# Patient Record
Sex: Male | Born: 1940 | Race: White | Hispanic: No | Marital: Married | Smoking: Former smoker
Health system: Southern US, Community
[De-identification: ages and names within clinical notes are randomized; demographics above are authoritative.]

## PROBLEM LIST (undated history)

## (undated) DIAGNOSIS — M1990 Unspecified osteoarthritis, unspecified site: Secondary | ICD-10-CM

## (undated) DIAGNOSIS — Z87442 Personal history of urinary calculi: Secondary | ICD-10-CM

## (undated) DIAGNOSIS — I10 Essential (primary) hypertension: Secondary | ICD-10-CM

## (undated) HISTORY — PX: APPENDECTOMY: SHX54

---

## 1982-10-28 HISTORY — PX: BACK SURGERY: SHX140

## 2015-02-21 ENCOUNTER — Other Ambulatory Visit: Payer: Self-pay | Admitting: Gastroenterology

## 2015-02-21 ENCOUNTER — Encounter (HOSPITAL_COMMUNITY): Payer: Self-pay | Admitting: *Deleted

## 2015-02-28 ENCOUNTER — Ambulatory Visit (HOSPITAL_COMMUNITY)
Admission: RE | Admit: 2015-02-28 | Discharge: 2015-02-28 | Disposition: A | Discharge status: Home / Self Care | Payer: Medicare Other | Source: Ambulatory Visit | Attending: Gastroenterology | Admitting: Gastroenterology

## 2015-02-28 ENCOUNTER — Encounter (HOSPITAL_COMMUNITY): Payer: Self-pay | Admitting: Certified Registered Nurse Anesthetist

## 2015-02-28 ENCOUNTER — Encounter (HOSPITAL_COMMUNITY)
Admission: RE | Disposition: A | Discharge status: Home / Self Care | Payer: Self-pay | Source: Ambulatory Visit | Attending: Gastroenterology

## 2015-02-28 ENCOUNTER — Ambulatory Visit (HOSPITAL_COMMUNITY): Payer: Medicare Other | Admitting: Certified Registered Nurse Anesthetist

## 2015-02-28 DIAGNOSIS — D125 Benign neoplasm of sigmoid colon: Secondary | ICD-10-CM | POA: Diagnosis not present

## 2015-02-28 DIAGNOSIS — Z86010 Personal history of colonic polyps: Secondary | ICD-10-CM | POA: Insufficient documentation

## 2015-02-28 DIAGNOSIS — I10 Essential (primary) hypertension: Secondary | ICD-10-CM | POA: Insufficient documentation

## 2015-02-28 DIAGNOSIS — M1990 Unspecified osteoarthritis, unspecified site: Secondary | ICD-10-CM | POA: Insufficient documentation

## 2015-02-28 DIAGNOSIS — Z79899 Other long term (current) drug therapy: Secondary | ICD-10-CM | POA: Insufficient documentation

## 2015-02-28 DIAGNOSIS — Z09 Encounter for follow-up examination after completed treatment for conditions other than malignant neoplasm: Secondary | ICD-10-CM | POA: Diagnosis present

## 2015-02-28 DIAGNOSIS — Z87891 Personal history of nicotine dependence: Secondary | ICD-10-CM | POA: Diagnosis not present

## 2015-02-28 DIAGNOSIS — K621 Rectal polyp: Secondary | ICD-10-CM | POA: Diagnosis not present

## 2015-02-28 DIAGNOSIS — D122 Benign neoplasm of ascending colon: Secondary | ICD-10-CM | POA: Insufficient documentation

## 2015-02-28 HISTORY — DX: Essential (primary) hypertension: I10

## 2015-02-28 HISTORY — PX: COLONOSCOPY WITH PROPOFOL: SHX5780

## 2015-02-28 HISTORY — DX: Personal history of urinary calculi: Z87.442

## 2015-02-28 HISTORY — DX: Unspecified osteoarthritis, unspecified site: M19.90

## 2015-02-28 SURGERY — COLONOSCOPY WITH PROPOFOL
Anesthesia: Monitor Anesthesia Care

## 2015-02-28 MED ORDER — PROPOFOL 10 MG/ML IV BOLUS
INTRAVENOUS | Status: AC
  Filled 2015-02-28: qty 20

## 2015-02-28 MED ORDER — PROPOFOL 500 MG/50ML IV EMUL
INTRAVENOUS | Status: DC | PRN
  Administered 2015-02-28: 20 mg via INTRAVENOUS
  Administered 2015-02-28: 50 mg via INTRAVENOUS
  Administered 2015-02-28 (×4): 10 mg via INTRAVENOUS
  Administered 2015-02-28: 20 mg via INTRAVENOUS
  Administered 2015-02-28: 10 mg via INTRAVENOUS
  Administered 2015-02-28: 30 mg via INTRAVENOUS
  Administered 2015-02-28 (×2): 10 mg via INTRAVENOUS
  Administered 2015-02-28: 20 mg via INTRAVENOUS
  Administered 2015-02-28: 50 mg via INTRAVENOUS
  Administered 2015-02-28: 10 mg via INTRAVENOUS
  Administered 2015-02-28: 30 mg via INTRAVENOUS
  Administered 2015-02-28: 10 mg via INTRAVENOUS
  Administered 2015-02-28: 20 mg via INTRAVENOUS

## 2015-02-28 MED ORDER — LACTATED RINGERS IV SOLN
INTRAVENOUS | Status: DC
Start: 2015-02-28 — End: 2015-02-28
  Administered 2015-02-28: 1000 mL via INTRAVENOUS
  Administered 2015-02-28: 11:00:00 via INTRAVENOUS

## 2015-02-28 MED ORDER — SODIUM CHLORIDE 0.9 % IV SOLN: INTRAVENOUS | Status: DC

## 2015-02-28 SURGICAL SUPPLY — 21 items

## 2015-02-28 NOTE — Transfer of Care (Signed)
Immediate Anesthesia Transfer of Care Note  Patient: Jacob Adkins  Procedure(s) Performed: Procedure(s): COLONOSCOPY WITH PROPOFOL (N/A)  Patient Location: PACU  Anesthesia Type:MAC  Level of Consciousness: awake, alert  and oriented  Airway & Oxygen Therapy: Patient Spontanous Breathing and Patient connected to face mask oxygen  Post-op Assessment: Report given to RN and Post -op Vital signs reviewed and stable  Post vital signs: Reviewed and stable  Last Vitals:  Filed Vitals:   02/28/15 1048  BP: 151/69  Temp: 36.8 C  Resp: 14    Complications: No apparent anesthesia complications

## 2015-02-28 NOTE — Anesthesia Preprocedure Evaluation (Signed)
Anesthesia Evaluation  Patient identified by MRN, date of birth, ID band Patient awake    Reviewed: Allergy & Precautions, NPO status , Patient's Chart, lab work & pertinent test results  Airway Mallampati: II  TM Distance: >3 FB Neck ROM: Full    Dental no notable dental hx.    Pulmonary former smoker,  breath sounds clear to auscultation  Pulmonary exam normal       Cardiovascular Exercise Tolerance: Good hypertension, Pt. on medications Rhythm:Regular Rate:Normal     Neuro/Psych negative neurological ROS  negative psych ROS   GI/Hepatic negative GI ROS, Neg liver ROS,   Endo/Other  negative endocrine ROS  Renal/GU negative Renal ROS  negative genitourinary   Musculoskeletal  (+) Arthritis -,   Abdominal   Peds negative pediatric ROS (+)  Hematology negative hematology ROS (+)   Anesthesia Other Findings   Reproductive/Obstetrics negative OB ROS                             Anesthesia Physical Anesthesia Plan  ASA: II  Anesthesia Plan: MAC   Post-op Pain Management:    Induction: Intravenous  Airway Management Planned:   Additional Equipment:   Intra-op Plan:   Post-operative Plan:   Informed Consent: I have reviewed the patients History and Physical, chart, labs and discussed the procedure including the risks, benefits and alternatives for the proposed anesthesia with the patient or authorized representative who has indicated his/her understanding and acceptance.   Dental advisory given  Plan Discussed with: CRNA  Anesthesia Plan Comments:         Anesthesia Quick Evaluation

## 2015-02-28 NOTE — Anesthesia Postprocedure Evaluation (Signed)
  Anesthesia Post-op Note  Patient: Jacob Adkins  Procedure(s) Performed: Procedure(s) (LRB): COLONOSCOPY WITH PROPOFOL (N/A)  Patient Location: PACU  Anesthesia Type: MAC  Level of Consciousness: awake and alert   Airway and Oxygen Therapy: Patient Spontanous Breathing  Post-op Pain: mild  Post-op Assessment: Post-op Vital signs reviewed, Patient's Cardiovascular Status Stable, Respiratory Function Stable, Patent Airway and No signs of Nausea or vomiting  Last Vitals:  Filed Vitals:   02/28/15 1200  BP: 147/90  Pulse: 71  Temp:   Resp: 18    Post-op Vital Signs: stable   Complications: No apparent anesthesia complications

## 2015-02-28 NOTE — H&P (Signed)
  Procedure: Surveillance colonoscopy. Adenomatous colon polyps removed colonoscopically in 2013  History: The patient is a 74 year old male born Mar 18, 1941. He is scheduled to undergo a surveillance colonoscopy today.  Past medical history: Hypertension. Osteoarthritis. Bee sting allergy. Appendectomy. Back surgery for ruptured disc. Left arm fracture surgery. Left inguinal hernia.  Allergies: Bee sting  Exam: The patient is alert and lying comfortably on the endoscopy stretcher. Abdomen is soft and nontender to palpation. Lungs are clear to auscultation. Cardiac exam reveals a regular rhythm.  Plan: Proceed with surveillance colonoscopy

## 2015-02-28 NOTE — Op Note (Signed)
Procedure: Surveillance colonoscopy. History of adenomatous colon polyps removed colonoscopically in the past  Endoscopist: Earle Gell  Premedication: Propofol administered by anesthesia  Procedure: The patient was placed in the left lateral decubitus position. Anal inspection and digital rectal exam were normal. The Pentax pediatric colonoscope was introduced into the rectum and advanced to the cecum. A normal-appearing appendiceal orifice and ileocecal valve were identified. Colonic preparation for the exam today was good. Withdrawal time was 25 minutes  Rectum. From the mid rectum a 3 mm sessile polyp was removed with the cold snare. Retroflexed view of the distal rectum was normal  Sigmoid colon. From the mid sigmoid colon, a 3 mm sessile polyp was removed with the cold biopsy forceps  Descending colon. Normal  Splenic flexure. Normal  Transverse colon. Normal  Hepatic flexure. Normal  Ascending colon. A 5 mm sessile polyp was removed with the hot snare and a 3 mm sessile polyp was removed with the cold biopsy forceps  Cecum and ileocecal valve. Normal  Assessment: A diminutive polyp was removed from the rectum, a diminutive polyp was removed from the sigmoid colon, a diminutive polyp was removed from the ascending colon, and a small polyp was removed from the ascending colon.

## 2015-03-01 ENCOUNTER — Encounter (HOSPITAL_COMMUNITY): Payer: Self-pay | Admitting: Gastroenterology

## 2015-11-30 DIAGNOSIS — T63461D Toxic effect of venom of wasps, accidental (unintentional), subsequent encounter: Secondary | ICD-10-CM | POA: Diagnosis not present

## 2015-11-30 DIAGNOSIS — T63451D Toxic effect of venom of hornets, accidental (unintentional), subsequent encounter: Secondary | ICD-10-CM | POA: Diagnosis not present

## 2016-01-12 DIAGNOSIS — T63451D Toxic effect of venom of hornets, accidental (unintentional), subsequent encounter: Secondary | ICD-10-CM | POA: Diagnosis not present

## 2016-01-12 DIAGNOSIS — T63461D Toxic effect of venom of wasps, accidental (unintentional), subsequent encounter: Secondary | ICD-10-CM | POA: Diagnosis not present

## 2016-02-26 DIAGNOSIS — T63451D Toxic effect of venom of hornets, accidental (unintentional), subsequent encounter: Secondary | ICD-10-CM | POA: Diagnosis not present

## 2016-02-26 DIAGNOSIS — T63461D Toxic effect of venom of wasps, accidental (unintentional), subsequent encounter: Secondary | ICD-10-CM | POA: Diagnosis not present

## 2016-03-28 DIAGNOSIS — D1801 Hemangioma of skin and subcutaneous tissue: Secondary | ICD-10-CM | POA: Diagnosis not present

## 2016-03-28 DIAGNOSIS — L570 Actinic keratosis: Secondary | ICD-10-CM | POA: Diagnosis not present

## 2016-03-28 DIAGNOSIS — L821 Other seborrheic keratosis: Secondary | ICD-10-CM | POA: Diagnosis not present

## 2016-03-28 DIAGNOSIS — D2261 Melanocytic nevi of right upper limb, including shoulder: Secondary | ICD-10-CM | POA: Diagnosis not present

## 2016-03-28 DIAGNOSIS — D225 Melanocytic nevi of trunk: Secondary | ICD-10-CM | POA: Diagnosis not present

## 2016-03-28 DIAGNOSIS — L814 Other melanin hyperpigmentation: Secondary | ICD-10-CM | POA: Diagnosis not present

## 2016-04-15 DIAGNOSIS — T63451D Toxic effect of venom of hornets, accidental (unintentional), subsequent encounter: Secondary | ICD-10-CM | POA: Diagnosis not present

## 2016-04-15 DIAGNOSIS — T63461D Toxic effect of venom of wasps, accidental (unintentional), subsequent encounter: Secondary | ICD-10-CM | POA: Diagnosis not present

## 2016-05-27 DIAGNOSIS — I10 Essential (primary) hypertension: Secondary | ICD-10-CM | POA: Diagnosis not present

## 2016-05-27 DIAGNOSIS — T63451D Toxic effect of venom of hornets, accidental (unintentional), subsequent encounter: Secondary | ICD-10-CM | POA: Diagnosis not present

## 2016-05-27 DIAGNOSIS — T63461D Toxic effect of venom of wasps, accidental (unintentional), subsequent encounter: Secondary | ICD-10-CM | POA: Diagnosis not present

## 2016-07-03 DIAGNOSIS — L720 Epidermal cyst: Secondary | ICD-10-CM | POA: Diagnosis not present

## 2016-07-03 DIAGNOSIS — D225 Melanocytic nevi of trunk: Secondary | ICD-10-CM | POA: Diagnosis not present

## 2016-07-03 DIAGNOSIS — L821 Other seborrheic keratosis: Secondary | ICD-10-CM | POA: Diagnosis not present

## 2016-07-09 DIAGNOSIS — T63451D Toxic effect of venom of hornets, accidental (unintentional), subsequent encounter: Secondary | ICD-10-CM | POA: Diagnosis not present

## 2016-07-09 DIAGNOSIS — T63461D Toxic effect of venom of wasps, accidental (unintentional), subsequent encounter: Secondary | ICD-10-CM | POA: Diagnosis not present

## 2016-07-23 DIAGNOSIS — T63461D Toxic effect of venom of wasps, accidental (unintentional), subsequent encounter: Secondary | ICD-10-CM | POA: Diagnosis not present

## 2016-07-23 DIAGNOSIS — T63451D Toxic effect of venom of hornets, accidental (unintentional), subsequent encounter: Secondary | ICD-10-CM | POA: Diagnosis not present

## 2016-07-30 DIAGNOSIS — T63451D Toxic effect of venom of hornets, accidental (unintentional), subsequent encounter: Secondary | ICD-10-CM | POA: Diagnosis not present

## 2016-07-30 DIAGNOSIS — T63461D Toxic effect of venom of wasps, accidental (unintentional), subsequent encounter: Secondary | ICD-10-CM | POA: Diagnosis not present

## 2016-08-01 DIAGNOSIS — Z23 Encounter for immunization: Secondary | ICD-10-CM | POA: Diagnosis not present

## 2016-08-06 DIAGNOSIS — T63451D Toxic effect of venom of hornets, accidental (unintentional), subsequent encounter: Secondary | ICD-10-CM | POA: Diagnosis not present

## 2016-08-06 DIAGNOSIS — T63461D Toxic effect of venom of wasps, accidental (unintentional), subsequent encounter: Secondary | ICD-10-CM | POA: Diagnosis not present

## 2016-08-13 DIAGNOSIS — T63451D Toxic effect of venom of hornets, accidental (unintentional), subsequent encounter: Secondary | ICD-10-CM | POA: Diagnosis not present

## 2016-08-13 DIAGNOSIS — T63461D Toxic effect of venom of wasps, accidental (unintentional), subsequent encounter: Secondary | ICD-10-CM | POA: Diagnosis not present

## 2016-09-12 DIAGNOSIS — T63441D Toxic effect of venom of bees, accidental (unintentional), subsequent encounter: Secondary | ICD-10-CM | POA: Diagnosis not present

## 2016-09-12 DIAGNOSIS — T63451D Toxic effect of venom of hornets, accidental (unintentional), subsequent encounter: Secondary | ICD-10-CM | POA: Diagnosis not present

## 2016-09-24 DIAGNOSIS — T63461D Toxic effect of venom of wasps, accidental (unintentional), subsequent encounter: Secondary | ICD-10-CM | POA: Diagnosis not present

## 2016-09-24 DIAGNOSIS — T63451D Toxic effect of venom of hornets, accidental (unintentional), subsequent encounter: Secondary | ICD-10-CM | POA: Diagnosis not present

## 2016-11-05 DIAGNOSIS — T63461D Toxic effect of venom of wasps, accidental (unintentional), subsequent encounter: Secondary | ICD-10-CM | POA: Diagnosis not present

## 2016-11-05 DIAGNOSIS — T63451D Toxic effect of venom of hornets, accidental (unintentional), subsequent encounter: Secondary | ICD-10-CM | POA: Diagnosis not present

## 2016-12-03 DIAGNOSIS — Z0000 Encounter for general adult medical examination without abnormal findings: Secondary | ICD-10-CM | POA: Diagnosis not present

## 2016-12-03 DIAGNOSIS — Z79899 Other long term (current) drug therapy: Secondary | ICD-10-CM | POA: Diagnosis not present

## 2016-12-03 DIAGNOSIS — Z1389 Encounter for screening for other disorder: Secondary | ICD-10-CM | POA: Diagnosis not present

## 2016-12-03 DIAGNOSIS — I10 Essential (primary) hypertension: Secondary | ICD-10-CM | POA: Diagnosis not present

## 2016-12-17 DIAGNOSIS — T63461D Toxic effect of venom of wasps, accidental (unintentional), subsequent encounter: Secondary | ICD-10-CM | POA: Diagnosis not present

## 2016-12-17 DIAGNOSIS — T63451D Toxic effect of venom of hornets, accidental (unintentional), subsequent encounter: Secondary | ICD-10-CM | POA: Diagnosis not present

## 2017-01-28 DIAGNOSIS — T63451D Toxic effect of venom of hornets, accidental (unintentional), subsequent encounter: Secondary | ICD-10-CM | POA: Diagnosis not present

## 2017-01-28 DIAGNOSIS — T63461D Toxic effect of venom of wasps, accidental (unintentional), subsequent encounter: Secondary | ICD-10-CM | POA: Diagnosis not present

## 2017-03-11 DIAGNOSIS — T63451D Toxic effect of venom of hornets, accidental (unintentional), subsequent encounter: Secondary | ICD-10-CM | POA: Diagnosis not present

## 2017-03-11 DIAGNOSIS — T63461D Toxic effect of venom of wasps, accidental (unintentional), subsequent encounter: Secondary | ICD-10-CM | POA: Diagnosis not present

## 2017-04-21 DIAGNOSIS — T63451D Toxic effect of venom of hornets, accidental (unintentional), subsequent encounter: Secondary | ICD-10-CM | POA: Diagnosis not present

## 2017-04-21 DIAGNOSIS — T63461D Toxic effect of venom of wasps, accidental (unintentional), subsequent encounter: Secondary | ICD-10-CM | POA: Diagnosis not present

## 2017-06-02 DIAGNOSIS — I10 Essential (primary) hypertension: Secondary | ICD-10-CM | POA: Diagnosis not present

## 2017-06-03 DIAGNOSIS — T63461D Toxic effect of venom of wasps, accidental (unintentional), subsequent encounter: Secondary | ICD-10-CM | POA: Diagnosis not present

## 2017-06-03 DIAGNOSIS — T63451D Toxic effect of venom of hornets, accidental (unintentional), subsequent encounter: Secondary | ICD-10-CM | POA: Diagnosis not present

## 2017-06-05 DIAGNOSIS — H524 Presbyopia: Secondary | ICD-10-CM | POA: Diagnosis not present

## 2017-07-10 DIAGNOSIS — Z23 Encounter for immunization: Secondary | ICD-10-CM | POA: Diagnosis not present

## 2017-07-15 DIAGNOSIS — T63451D Toxic effect of venom of hornets, accidental (unintentional), subsequent encounter: Secondary | ICD-10-CM | POA: Diagnosis not present

## 2017-07-15 DIAGNOSIS — T63461D Toxic effect of venom of wasps, accidental (unintentional), subsequent encounter: Secondary | ICD-10-CM | POA: Diagnosis not present

## 2017-08-26 DIAGNOSIS — T63451D Toxic effect of venom of hornets, accidental (unintentional), subsequent encounter: Secondary | ICD-10-CM | POA: Diagnosis not present

## 2017-08-26 DIAGNOSIS — T63461D Toxic effect of venom of wasps, accidental (unintentional), subsequent encounter: Secondary | ICD-10-CM | POA: Diagnosis not present

## 2017-09-26 DIAGNOSIS — T63441D Toxic effect of venom of bees, accidental (unintentional), subsequent encounter: Secondary | ICD-10-CM | POA: Diagnosis not present

## 2017-09-26 DIAGNOSIS — T63451D Toxic effect of venom of hornets, accidental (unintentional), subsequent encounter: Secondary | ICD-10-CM | POA: Diagnosis not present

## 2017-10-08 DIAGNOSIS — T63461D Toxic effect of venom of wasps, accidental (unintentional), subsequent encounter: Secondary | ICD-10-CM | POA: Diagnosis not present

## 2017-10-08 DIAGNOSIS — T63451D Toxic effect of venom of hornets, accidental (unintentional), subsequent encounter: Secondary | ICD-10-CM | POA: Diagnosis not present

## 2017-11-21 DIAGNOSIS — T63461D Toxic effect of venom of wasps, accidental (unintentional), subsequent encounter: Secondary | ICD-10-CM | POA: Diagnosis not present

## 2017-11-21 DIAGNOSIS — T63451D Toxic effect of venom of hornets, accidental (unintentional), subsequent encounter: Secondary | ICD-10-CM | POA: Diagnosis not present

## 2017-12-15 DIAGNOSIS — Z0000 Encounter for general adult medical examination without abnormal findings: Secondary | ICD-10-CM | POA: Diagnosis not present

## 2017-12-15 DIAGNOSIS — Z79899 Other long term (current) drug therapy: Secondary | ICD-10-CM | POA: Diagnosis not present

## 2017-12-15 DIAGNOSIS — I10 Essential (primary) hypertension: Secondary | ICD-10-CM | POA: Diagnosis not present

## 2017-12-15 DIAGNOSIS — Z1389 Encounter for screening for other disorder: Secondary | ICD-10-CM | POA: Diagnosis not present

## 2018-01-02 DIAGNOSIS — T63451D Toxic effect of venom of hornets, accidental (unintentional), subsequent encounter: Secondary | ICD-10-CM | POA: Diagnosis not present

## 2018-01-02 DIAGNOSIS — T63461D Toxic effect of venom of wasps, accidental (unintentional), subsequent encounter: Secondary | ICD-10-CM | POA: Diagnosis not present

## 2018-02-16 DIAGNOSIS — T63461D Toxic effect of venom of wasps, accidental (unintentional), subsequent encounter: Secondary | ICD-10-CM | POA: Diagnosis not present

## 2018-02-16 DIAGNOSIS — T63451D Toxic effect of venom of hornets, accidental (unintentional), subsequent encounter: Secondary | ICD-10-CM | POA: Diagnosis not present

## 2018-03-27 DIAGNOSIS — T63451D Toxic effect of venom of hornets, accidental (unintentional), subsequent encounter: Secondary | ICD-10-CM | POA: Diagnosis not present

## 2018-03-27 DIAGNOSIS — T63461D Toxic effect of venom of wasps, accidental (unintentional), subsequent encounter: Secondary | ICD-10-CM | POA: Diagnosis not present

## 2018-03-30 DIAGNOSIS — K648 Other hemorrhoids: Secondary | ICD-10-CM | POA: Diagnosis not present

## 2018-03-30 DIAGNOSIS — D126 Benign neoplasm of colon, unspecified: Secondary | ICD-10-CM | POA: Diagnosis not present

## 2018-03-30 DIAGNOSIS — Z86010 Personal history of colonic polyps: Secondary | ICD-10-CM | POA: Diagnosis not present

## 2018-03-30 DIAGNOSIS — K5730 Diverticulosis of large intestine without perforation or abscess without bleeding: Secondary | ICD-10-CM | POA: Diagnosis not present

## 2018-04-01 DIAGNOSIS — D126 Benign neoplasm of colon, unspecified: Secondary | ICD-10-CM | POA: Diagnosis not present

## 2018-05-08 DIAGNOSIS — T63461D Toxic effect of venom of wasps, accidental (unintentional), subsequent encounter: Secondary | ICD-10-CM | POA: Diagnosis not present

## 2018-05-08 DIAGNOSIS — T63451D Toxic effect of venom of hornets, accidental (unintentional), subsequent encounter: Secondary | ICD-10-CM | POA: Diagnosis not present

## 2018-06-16 DIAGNOSIS — I10 Essential (primary) hypertension: Secondary | ICD-10-CM | POA: Diagnosis not present

## 2018-06-16 DIAGNOSIS — Z79899 Other long term (current) drug therapy: Secondary | ICD-10-CM | POA: Diagnosis not present

## 2018-06-16 DIAGNOSIS — R32 Unspecified urinary incontinence: Secondary | ICD-10-CM | POA: Diagnosis not present

## 2018-06-19 DIAGNOSIS — T63441D Toxic effect of venom of bees, accidental (unintentional), subsequent encounter: Secondary | ICD-10-CM | POA: Diagnosis not present

## 2018-06-19 DIAGNOSIS — T63451D Toxic effect of venom of hornets, accidental (unintentional), subsequent encounter: Secondary | ICD-10-CM | POA: Diagnosis not present

## 2018-06-19 DIAGNOSIS — T63461D Toxic effect of venom of wasps, accidental (unintentional), subsequent encounter: Secondary | ICD-10-CM | POA: Diagnosis not present

## 2018-07-16 DIAGNOSIS — Z23 Encounter for immunization: Secondary | ICD-10-CM | POA: Diagnosis not present

## 2018-07-31 DIAGNOSIS — T63441D Toxic effect of venom of bees, accidental (unintentional), subsequent encounter: Secondary | ICD-10-CM | POA: Diagnosis not present

## 2018-07-31 DIAGNOSIS — T63461D Toxic effect of venom of wasps, accidental (unintentional), subsequent encounter: Secondary | ICD-10-CM | POA: Diagnosis not present

## 2018-07-31 DIAGNOSIS — T63451D Toxic effect of venom of hornets, accidental (unintentional), subsequent encounter: Secondary | ICD-10-CM | POA: Diagnosis not present

## 2018-09-11 DIAGNOSIS — T63461D Toxic effect of venom of wasps, accidental (unintentional), subsequent encounter: Secondary | ICD-10-CM | POA: Diagnosis not present

## 2018-09-11 DIAGNOSIS — T63441D Toxic effect of venom of bees, accidental (unintentional), subsequent encounter: Secondary | ICD-10-CM | POA: Diagnosis not present

## 2018-09-11 DIAGNOSIS — T63451D Toxic effect of venom of hornets, accidental (unintentional), subsequent encounter: Secondary | ICD-10-CM | POA: Diagnosis not present

## 2018-09-28 DIAGNOSIS — T63451D Toxic effect of venom of hornets, accidental (unintentional), subsequent encounter: Secondary | ICD-10-CM | POA: Diagnosis not present

## 2018-09-28 DIAGNOSIS — T63441D Toxic effect of venom of bees, accidental (unintentional), subsequent encounter: Secondary | ICD-10-CM | POA: Diagnosis not present

## 2018-10-26 DIAGNOSIS — T63461D Toxic effect of venom of wasps, accidental (unintentional), subsequent encounter: Secondary | ICD-10-CM | POA: Diagnosis not present

## 2018-10-26 DIAGNOSIS — T63451D Toxic effect of venom of hornets, accidental (unintentional), subsequent encounter: Secondary | ICD-10-CM | POA: Diagnosis not present

## 2018-12-07 DIAGNOSIS — T63451D Toxic effect of venom of hornets, accidental (unintentional), subsequent encounter: Secondary | ICD-10-CM | POA: Diagnosis not present

## 2018-12-07 DIAGNOSIS — T63461D Toxic effect of venom of wasps, accidental (unintentional), subsequent encounter: Secondary | ICD-10-CM | POA: Diagnosis not present

## 2018-12-24 DIAGNOSIS — Z79899 Other long term (current) drug therapy: Secondary | ICD-10-CM | POA: Diagnosis not present

## 2018-12-24 DIAGNOSIS — Z1389 Encounter for screening for other disorder: Secondary | ICD-10-CM | POA: Diagnosis not present

## 2018-12-24 DIAGNOSIS — I10 Essential (primary) hypertension: Secondary | ICD-10-CM | POA: Diagnosis not present

## 2018-12-24 DIAGNOSIS — Z0000 Encounter for general adult medical examination without abnormal findings: Secondary | ICD-10-CM | POA: Diagnosis not present

## 2019-01-06 DIAGNOSIS — Z0120 Encounter for dental examination and cleaning without abnormal findings: Secondary | ICD-10-CM | POA: Diagnosis not present

## 2019-01-15 DIAGNOSIS — T63451D Toxic effect of venom of hornets, accidental (unintentional), subsequent encounter: Secondary | ICD-10-CM | POA: Diagnosis not present

## 2019-01-15 DIAGNOSIS — T63461D Toxic effect of venom of wasps, accidental (unintentional), subsequent encounter: Secondary | ICD-10-CM | POA: Diagnosis not present

## 2019-02-24 DIAGNOSIS — I10 Essential (primary) hypertension: Secondary | ICD-10-CM | POA: Diagnosis not present

## 2019-02-24 DIAGNOSIS — G4733 Obstructive sleep apnea (adult) (pediatric): Secondary | ICD-10-CM | POA: Diagnosis not present

## 2019-02-25 DIAGNOSIS — G4733 Obstructive sleep apnea (adult) (pediatric): Secondary | ICD-10-CM | POA: Diagnosis not present

## 2019-02-26 DIAGNOSIS — T63451D Toxic effect of venom of hornets, accidental (unintentional), subsequent encounter: Secondary | ICD-10-CM | POA: Diagnosis not present

## 2019-02-26 DIAGNOSIS — T63461D Toxic effect of venom of wasps, accidental (unintentional), subsequent encounter: Secondary | ICD-10-CM | POA: Diagnosis not present

## 2019-04-09 DIAGNOSIS — T63451D Toxic effect of venom of hornets, accidental (unintentional), subsequent encounter: Secondary | ICD-10-CM | POA: Diagnosis not present

## 2019-04-09 DIAGNOSIS — T63461D Toxic effect of venom of wasps, accidental (unintentional), subsequent encounter: Secondary | ICD-10-CM | POA: Diagnosis not present

## 2019-04-09 DIAGNOSIS — T63441D Toxic effect of venom of bees, accidental (unintentional), subsequent encounter: Secondary | ICD-10-CM | POA: Diagnosis not present

## 2019-05-21 DIAGNOSIS — T63461D Toxic effect of venom of wasps, accidental (unintentional), subsequent encounter: Secondary | ICD-10-CM | POA: Diagnosis not present

## 2019-05-21 DIAGNOSIS — T63451D Toxic effect of venom of hornets, accidental (unintentional), subsequent encounter: Secondary | ICD-10-CM | POA: Diagnosis not present

## 2019-07-02 DIAGNOSIS — T63461D Toxic effect of venom of wasps, accidental (unintentional), subsequent encounter: Secondary | ICD-10-CM | POA: Diagnosis not present

## 2019-07-02 DIAGNOSIS — T63451D Toxic effect of venom of hornets, accidental (unintentional), subsequent encounter: Secondary | ICD-10-CM | POA: Diagnosis not present

## 2019-07-14 DIAGNOSIS — Z23 Encounter for immunization: Secondary | ICD-10-CM | POA: Diagnosis not present

## 2019-07-22 DIAGNOSIS — Z0120 Encounter for dental examination and cleaning without abnormal findings: Secondary | ICD-10-CM | POA: Diagnosis not present

## 2019-08-12 DIAGNOSIS — T63461D Toxic effect of venom of wasps, accidental (unintentional), subsequent encounter: Secondary | ICD-10-CM | POA: Diagnosis not present

## 2019-08-12 DIAGNOSIS — T63451D Toxic effect of venom of hornets, accidental (unintentional), subsequent encounter: Secondary | ICD-10-CM | POA: Diagnosis not present

## 2019-08-25 ENCOUNTER — Other Ambulatory Visit: Payer: Self-pay | Admitting: Geriatric Medicine

## 2019-08-25 DIAGNOSIS — R1084 Generalized abdominal pain: Secondary | ICD-10-CM | POA: Diagnosis not present

## 2019-08-25 DIAGNOSIS — I10 Essential (primary) hypertension: Secondary | ICD-10-CM | POA: Diagnosis not present

## 2019-08-25 DIAGNOSIS — R1903 Right lower quadrant abdominal swelling, mass and lump: Secondary | ICD-10-CM | POA: Diagnosis not present

## 2019-08-25 DIAGNOSIS — Z23 Encounter for immunization: Secondary | ICD-10-CM | POA: Diagnosis not present

## 2019-08-25 DIAGNOSIS — R1031 Right lower quadrant pain: Secondary | ICD-10-CM

## 2019-08-26 DIAGNOSIS — H5203 Hypermetropia, bilateral: Secondary | ICD-10-CM | POA: Diagnosis not present

## 2019-08-27 ENCOUNTER — Other Ambulatory Visit: Payer: Self-pay

## 2019-08-27 ENCOUNTER — Ambulatory Visit
Admission: RE | Admit: 2019-08-27 | Discharge: 2019-08-27 | Disposition: A | Discharge status: Home / Self Care | Payer: Medicare Other | Source: Ambulatory Visit | Attending: Geriatric Medicine | Admitting: Geriatric Medicine

## 2019-08-27 DIAGNOSIS — N1330 Unspecified hydronephrosis: Secondary | ICD-10-CM | POA: Diagnosis not present

## 2019-08-27 DIAGNOSIS — R1031 Right lower quadrant pain: Secondary | ICD-10-CM

## 2019-08-27 DIAGNOSIS — K4090 Unilateral inguinal hernia, without obstruction or gangrene, not specified as recurrent: Secondary | ICD-10-CM | POA: Diagnosis not present

## 2019-08-27 DIAGNOSIS — K429 Umbilical hernia without obstruction or gangrene: Secondary | ICD-10-CM | POA: Diagnosis not present

## 2019-08-27 DIAGNOSIS — I700 Atherosclerosis of aorta: Secondary | ICD-10-CM | POA: Diagnosis not present

## 2019-08-27 MED ORDER — IOPAMIDOL (ISOVUE-300) INJECTION 61%
100.0000 mL | Freq: Once | INTRAVENOUS | Status: AC | PRN
  Administered 2019-08-27: 100 mL via INTRAVENOUS

## 2019-09-09 DIAGNOSIS — N401 Enlarged prostate with lower urinary tract symptoms: Secondary | ICD-10-CM | POA: Diagnosis not present

## 2019-09-09 DIAGNOSIS — Z125 Encounter for screening for malignant neoplasm of prostate: Secondary | ICD-10-CM | POA: Diagnosis not present

## 2019-09-09 DIAGNOSIS — R338 Other retention of urine: Secondary | ICD-10-CM | POA: Diagnosis not present

## 2019-09-09 DIAGNOSIS — C775 Secondary and unspecified malignant neoplasm of intrapelvic lymph nodes: Secondary | ICD-10-CM | POA: Diagnosis not present

## 2019-09-09 DIAGNOSIS — N130 Hydronephrosis with ureteropelvic junction obstruction: Secondary | ICD-10-CM | POA: Diagnosis not present

## 2019-09-10 ENCOUNTER — Other Ambulatory Visit: Payer: Self-pay | Admitting: Urology

## 2019-09-10 DIAGNOSIS — N130 Hydronephrosis with ureteropelvic junction obstruction: Secondary | ICD-10-CM

## 2019-09-13 DIAGNOSIS — C775 Secondary and unspecified malignant neoplasm of intrapelvic lymph nodes: Secondary | ICD-10-CM | POA: Diagnosis not present

## 2019-09-13 DIAGNOSIS — N130 Hydronephrosis with ureteropelvic junction obstruction: Secondary | ICD-10-CM | POA: Diagnosis not present

## 2019-09-13 DIAGNOSIS — R338 Other retention of urine: Secondary | ICD-10-CM | POA: Diagnosis not present

## 2019-09-15 DIAGNOSIS — K4090 Unilateral inguinal hernia, without obstruction or gangrene, not specified as recurrent: Secondary | ICD-10-CM | POA: Diagnosis not present

## 2019-09-15 DIAGNOSIS — Z87891 Personal history of nicotine dependence: Secondary | ICD-10-CM | POA: Diagnosis not present

## 2019-09-15 DIAGNOSIS — K7689 Other specified diseases of liver: Secondary | ICD-10-CM | POA: Diagnosis not present

## 2019-09-15 DIAGNOSIS — I2510 Atherosclerotic heart disease of native coronary artery without angina pectoris: Secondary | ICD-10-CM | POA: Diagnosis not present

## 2019-09-15 DIAGNOSIS — Z86010 Personal history of colonic polyps: Secondary | ICD-10-CM | POA: Diagnosis not present

## 2019-09-15 DIAGNOSIS — N281 Cyst of kidney, acquired: Secondary | ICD-10-CM | POA: Diagnosis not present

## 2019-09-15 DIAGNOSIS — Z79899 Other long term (current) drug therapy: Secondary | ICD-10-CM | POA: Diagnosis not present

## 2019-09-15 DIAGNOSIS — N400 Enlarged prostate without lower urinary tract symptoms: Secondary | ICD-10-CM | POA: Diagnosis not present

## 2019-09-15 DIAGNOSIS — N323 Diverticulum of bladder: Secondary | ICD-10-CM | POA: Diagnosis not present

## 2019-09-15 DIAGNOSIS — K5730 Diverticulosis of large intestine without perforation or abscess without bleeding: Secondary | ICD-10-CM | POA: Diagnosis not present

## 2019-09-15 DIAGNOSIS — N1330 Unspecified hydronephrosis: Secondary | ICD-10-CM | POA: Diagnosis not present

## 2019-09-15 DIAGNOSIS — R590 Localized enlarged lymph nodes: Secondary | ICD-10-CM | POA: Diagnosis not present

## 2019-09-16 ENCOUNTER — Telehealth: Payer: Self-pay | Admitting: Oncology

## 2019-09-16 NOTE — Telephone Encounter (Signed)
Received a new patient referral from Dr. Gloriann Loan at [facility] for metastatic pelvic lymphadenopathy. Jacob Adkins has been cld and scheduled to see Jacob Adkins on 12/3 at 2pm. Aware to arrive 15 minutes early.

## 2019-09-20 DIAGNOSIS — N3289 Other specified disorders of bladder: Secondary | ICD-10-CM | POA: Diagnosis not present

## 2019-09-20 DIAGNOSIS — R339 Retention of urine, unspecified: Secondary | ICD-10-CM | POA: Diagnosis not present

## 2019-09-20 DIAGNOSIS — R599 Enlarged lymph nodes, unspecified: Secondary | ICD-10-CM | POA: Diagnosis not present

## 2019-09-20 DIAGNOSIS — N1330 Unspecified hydronephrosis: Secondary | ICD-10-CM | POA: Diagnosis not present

## 2019-09-21 ENCOUNTER — Ambulatory Visit
Admission: RE | Admit: 2019-09-21 | Discharge: 2019-09-21 | Disposition: A | Discharge status: Home / Self Care | Payer: Medicare Other | Source: Ambulatory Visit | Attending: Urology | Admitting: Urology

## 2019-09-21 ENCOUNTER — Telehealth: Payer: Self-pay | Admitting: Oncology

## 2019-09-21 DIAGNOSIS — N130 Hydronephrosis with ureteropelvic junction obstruction: Secondary | ICD-10-CM

## 2019-09-21 DIAGNOSIS — N1330 Unspecified hydronephrosis: Secondary | ICD-10-CM | POA: Diagnosis not present

## 2019-09-21 NOTE — Telephone Encounter (Signed)
Called patient regarding providers request, patient will be seeing another provider and would like to cancel 12/03 appointment.

## 2019-09-22 DIAGNOSIS — T63461D Toxic effect of venom of wasps, accidental (unintentional), subsequent encounter: Secondary | ICD-10-CM | POA: Diagnosis not present

## 2019-09-22 DIAGNOSIS — T63451D Toxic effect of venom of hornets, accidental (unintentional), subsequent encounter: Secondary | ICD-10-CM | POA: Diagnosis not present

## 2019-09-28 ENCOUNTER — Ambulatory Visit: Payer: Medicare Other | Admitting: Oncology

## 2019-09-28 DIAGNOSIS — N3289 Other specified disorders of bladder: Secondary | ICD-10-CM | POA: Diagnosis not present

## 2019-09-28 DIAGNOSIS — I2510 Atherosclerotic heart disease of native coronary artery without angina pectoris: Secondary | ICD-10-CM | POA: Diagnosis not present

## 2019-09-28 DIAGNOSIS — R590 Localized enlarged lymph nodes: Secondary | ICD-10-CM | POA: Diagnosis not present

## 2019-09-28 DIAGNOSIS — N1330 Unspecified hydronephrosis: Secondary | ICD-10-CM | POA: Diagnosis not present

## 2019-09-28 DIAGNOSIS — K4090 Unilateral inguinal hernia, without obstruction or gangrene, not specified as recurrent: Secondary | ICD-10-CM | POA: Diagnosis not present

## 2019-09-28 DIAGNOSIS — N281 Cyst of kidney, acquired: Secondary | ICD-10-CM | POA: Diagnosis not present

## 2019-09-28 DIAGNOSIS — K7689 Other specified diseases of liver: Secondary | ICD-10-CM | POA: Diagnosis not present

## 2019-09-28 DIAGNOSIS — N2889 Other specified disorders of kidney and ureter: Secondary | ICD-10-CM | POA: Diagnosis not present

## 2019-09-29 ENCOUNTER — Ambulatory Visit: Payer: Medicare Other | Admitting: Oncology

## 2019-09-29 DIAGNOSIS — Z79899 Other long term (current) drug therapy: Secondary | ICD-10-CM | POA: Diagnosis not present

## 2019-09-29 DIAGNOSIS — Z87891 Personal history of nicotine dependence: Secondary | ICD-10-CM | POA: Diagnosis not present

## 2019-09-29 DIAGNOSIS — Z9889 Other specified postprocedural states: Secondary | ICD-10-CM | POA: Diagnosis not present

## 2019-09-29 DIAGNOSIS — R599 Enlarged lymph nodes, unspecified: Secondary | ICD-10-CM | POA: Diagnosis not present

## 2019-09-29 DIAGNOSIS — N281 Cyst of kidney, acquired: Secondary | ICD-10-CM | POA: Diagnosis not present

## 2019-09-29 DIAGNOSIS — R590 Localized enlarged lymph nodes: Secondary | ICD-10-CM | POA: Diagnosis not present

## 2019-09-30 ENCOUNTER — Ambulatory Visit: Payer: Medicare Other | Admitting: Oncology

## 2019-10-04 DIAGNOSIS — R591 Generalized enlarged lymph nodes: Secondary | ICD-10-CM | POA: Diagnosis not present

## 2019-10-04 DIAGNOSIS — T63441D Toxic effect of venom of bees, accidental (unintentional), subsequent encounter: Secondary | ICD-10-CM | POA: Diagnosis not present

## 2019-10-04 DIAGNOSIS — T63451D Toxic effect of venom of hornets, accidental (unintentional), subsequent encounter: Secondary | ICD-10-CM | POA: Diagnosis not present

## 2019-10-05 DIAGNOSIS — K429 Umbilical hernia without obstruction or gangrene: Secondary | ICD-10-CM | POA: Diagnosis not present

## 2019-10-05 DIAGNOSIS — R590 Localized enlarged lymph nodes: Secondary | ICD-10-CM | POA: Diagnosis not present

## 2019-10-05 DIAGNOSIS — N2889 Other specified disorders of kidney and ureter: Secondary | ICD-10-CM | POA: Diagnosis not present

## 2019-10-05 DIAGNOSIS — N3289 Other specified disorders of bladder: Secondary | ICD-10-CM | POA: Diagnosis not present

## 2019-10-05 DIAGNOSIS — R339 Retention of urine, unspecified: Secondary | ICD-10-CM | POA: Diagnosis not present

## 2019-10-05 DIAGNOSIS — N1330 Unspecified hydronephrosis: Secondary | ICD-10-CM | POA: Diagnosis not present

## 2019-10-06 DIAGNOSIS — R339 Retention of urine, unspecified: Secondary | ICD-10-CM | POA: Diagnosis not present

## 2019-10-06 DIAGNOSIS — N1330 Unspecified hydronephrosis: Secondary | ICD-10-CM | POA: Diagnosis not present

## 2019-10-06 DIAGNOSIS — N400 Enlarged prostate without lower urinary tract symptoms: Secondary | ICD-10-CM | POA: Diagnosis not present

## 2019-10-06 DIAGNOSIS — N281 Cyst of kidney, acquired: Secondary | ICD-10-CM | POA: Diagnosis not present

## 2019-10-06 DIAGNOSIS — N2889 Other specified disorders of kidney and ureter: Secondary | ICD-10-CM | POA: Diagnosis not present

## 2019-10-07 DIAGNOSIS — R590 Localized enlarged lymph nodes: Secondary | ICD-10-CM | POA: Diagnosis not present

## 2019-10-07 DIAGNOSIS — N400 Enlarged prostate without lower urinary tract symptoms: Secondary | ICD-10-CM | POA: Diagnosis not present

## 2019-10-07 DIAGNOSIS — Z01812 Encounter for preprocedural laboratory examination: Secondary | ICD-10-CM | POA: Diagnosis not present

## 2019-10-07 DIAGNOSIS — Z01810 Encounter for preprocedural cardiovascular examination: Secondary | ICD-10-CM | POA: Diagnosis not present

## 2019-10-07 DIAGNOSIS — Z01818 Encounter for other preprocedural examination: Secondary | ICD-10-CM | POA: Diagnosis not present

## 2019-10-07 DIAGNOSIS — I10 Essential (primary) hypertension: Secondary | ICD-10-CM | POA: Diagnosis not present

## 2019-10-07 DIAGNOSIS — K429 Umbilical hernia without obstruction or gangrene: Secondary | ICD-10-CM | POA: Diagnosis not present

## 2019-10-07 DIAGNOSIS — N2889 Other specified disorders of kidney and ureter: Secondary | ICD-10-CM | POA: Diagnosis not present

## 2019-10-07 DIAGNOSIS — Z87891 Personal history of nicotine dependence: Secondary | ICD-10-CM | POA: Diagnosis not present

## 2019-10-08 DIAGNOSIS — Z01818 Encounter for other preprocedural examination: Secondary | ICD-10-CM | POA: Diagnosis not present

## 2019-10-11 DIAGNOSIS — I2510 Atherosclerotic heart disease of native coronary artery without angina pectoris: Secondary | ICD-10-CM | POA: Diagnosis not present

## 2019-10-11 DIAGNOSIS — Z91030 Bee allergy status: Secondary | ICD-10-CM | POA: Diagnosis not present

## 2019-10-11 DIAGNOSIS — N1330 Unspecified hydronephrosis: Secondary | ICD-10-CM | POA: Diagnosis not present

## 2019-10-11 DIAGNOSIS — Z86010 Personal history of colonic polyps: Secondary | ICD-10-CM | POA: Diagnosis not present

## 2019-10-11 DIAGNOSIS — C689 Malignant neoplasm of urinary organ, unspecified: Secondary | ICD-10-CM | POA: Diagnosis not present

## 2019-10-11 DIAGNOSIS — Z466 Encounter for fitting and adjustment of urinary device: Secondary | ICD-10-CM | POA: Diagnosis not present

## 2019-10-11 DIAGNOSIS — C652 Malignant neoplasm of left renal pelvis: Secondary | ICD-10-CM | POA: Diagnosis not present

## 2019-10-11 DIAGNOSIS — N2889 Other specified disorders of kidney and ureter: Secondary | ICD-10-CM | POA: Diagnosis not present

## 2019-10-11 DIAGNOSIS — M1990 Unspecified osteoarthritis, unspecified site: Secondary | ICD-10-CM | POA: Diagnosis not present

## 2019-10-15 DIAGNOSIS — C642 Malignant neoplasm of left kidney, except renal pelvis: Secondary | ICD-10-CM | POA: Diagnosis not present

## 2019-10-15 DIAGNOSIS — R590 Localized enlarged lymph nodes: Secondary | ICD-10-CM | POA: Diagnosis not present

## 2019-10-15 DIAGNOSIS — K429 Umbilical hernia without obstruction or gangrene: Secondary | ICD-10-CM | POA: Diagnosis not present

## 2019-10-15 DIAGNOSIS — K7689 Other specified diseases of liver: Secondary | ICD-10-CM | POA: Diagnosis not present

## 2019-10-15 DIAGNOSIS — N2889 Other specified disorders of kidney and ureter: Secondary | ICD-10-CM | POA: Diagnosis not present

## 2019-10-15 DIAGNOSIS — K4090 Unilateral inguinal hernia, without obstruction or gangrene, not specified as recurrent: Secondary | ICD-10-CM | POA: Diagnosis not present

## 2019-10-15 DIAGNOSIS — Z5111 Encounter for antineoplastic chemotherapy: Secondary | ICD-10-CM | POA: Diagnosis not present

## 2019-10-15 DIAGNOSIS — N3289 Other specified disorders of bladder: Secondary | ICD-10-CM | POA: Diagnosis not present

## 2019-10-15 DIAGNOSIS — N1330 Unspecified hydronephrosis: Secondary | ICD-10-CM | POA: Diagnosis not present

## 2019-10-15 DIAGNOSIS — I2510 Atherosclerotic heart disease of native coronary artery without angina pectoris: Secondary | ICD-10-CM | POA: Diagnosis not present

## 2019-10-18 DIAGNOSIS — C642 Malignant neoplasm of left kidney, except renal pelvis: Secondary | ICD-10-CM | POA: Diagnosis not present

## 2019-10-18 DIAGNOSIS — Z86010 Personal history of colonic polyps: Secondary | ICD-10-CM | POA: Diagnosis not present

## 2019-10-18 DIAGNOSIS — Z87891 Personal history of nicotine dependence: Secondary | ICD-10-CM | POA: Diagnosis not present

## 2019-10-18 DIAGNOSIS — Z452 Encounter for adjustment and management of vascular access device: Secondary | ICD-10-CM | POA: Diagnosis not present

## 2019-10-18 DIAGNOSIS — Z5111 Encounter for antineoplastic chemotherapy: Secondary | ICD-10-CM | POA: Diagnosis not present

## 2019-10-18 DIAGNOSIS — C679 Malignant neoplasm of bladder, unspecified: Secondary | ICD-10-CM | POA: Diagnosis not present

## 2019-10-18 DIAGNOSIS — I10 Essential (primary) hypertension: Secondary | ICD-10-CM | POA: Diagnosis not present

## 2019-10-19 DIAGNOSIS — R339 Retention of urine, unspecified: Secondary | ICD-10-CM | POA: Diagnosis not present

## 2019-10-19 DIAGNOSIS — C679 Malignant neoplasm of bladder, unspecified: Secondary | ICD-10-CM | POA: Diagnosis not present

## 2019-10-19 DIAGNOSIS — N1330 Unspecified hydronephrosis: Secondary | ICD-10-CM | POA: Diagnosis not present

## 2019-10-19 DIAGNOSIS — C642 Malignant neoplasm of left kidney, except renal pelvis: Secondary | ICD-10-CM | POA: Diagnosis not present

## 2019-10-19 DIAGNOSIS — M8580 Other specified disorders of bone density and structure, unspecified site: Secondary | ICD-10-CM | POA: Diagnosis not present

## 2019-10-25 DIAGNOSIS — R339 Retention of urine, unspecified: Secondary | ICD-10-CM | POA: Diagnosis not present

## 2019-10-27 DIAGNOSIS — R590 Localized enlarged lymph nodes: Secondary | ICD-10-CM | POA: Diagnosis not present

## 2019-10-27 DIAGNOSIS — K4090 Unilateral inguinal hernia, without obstruction or gangrene, not specified as recurrent: Secondary | ICD-10-CM | POA: Diagnosis not present

## 2019-10-27 DIAGNOSIS — I2510 Atherosclerotic heart disease of native coronary artery without angina pectoris: Secondary | ICD-10-CM | POA: Diagnosis not present

## 2019-10-27 DIAGNOSIS — K429 Umbilical hernia without obstruction or gangrene: Secondary | ICD-10-CM | POA: Diagnosis not present

## 2019-10-27 DIAGNOSIS — C642 Malignant neoplasm of left kidney, except renal pelvis: Secondary | ICD-10-CM | POA: Diagnosis not present

## 2019-10-27 DIAGNOSIS — N1330 Unspecified hydronephrosis: Secondary | ICD-10-CM | POA: Diagnosis not present

## 2019-10-27 DIAGNOSIS — K7689 Other specified diseases of liver: Secondary | ICD-10-CM | POA: Diagnosis not present

## 2019-10-27 DIAGNOSIS — N3289 Other specified disorders of bladder: Secondary | ICD-10-CM | POA: Diagnosis not present

## 2019-10-27 DIAGNOSIS — Z5111 Encounter for antineoplastic chemotherapy: Secondary | ICD-10-CM | POA: Diagnosis not present

## 2019-10-27 DIAGNOSIS — N2889 Other specified disorders of kidney and ureter: Secondary | ICD-10-CM | POA: Diagnosis not present

## 2019-10-30 DIAGNOSIS — Z87891 Personal history of nicotine dependence: Secondary | ICD-10-CM | POA: Diagnosis not present

## 2019-10-30 DIAGNOSIS — R791 Abnormal coagulation profile: Secondary | ICD-10-CM | POA: Diagnosis not present

## 2019-10-30 DIAGNOSIS — Z20822 Contact with and (suspected) exposure to covid-19: Secondary | ICD-10-CM | POA: Diagnosis not present

## 2019-10-30 DIAGNOSIS — M79605 Pain in left leg: Secondary | ICD-10-CM | POA: Diagnosis not present

## 2019-10-30 DIAGNOSIS — M79662 Pain in left lower leg: Secondary | ICD-10-CM | POA: Diagnosis not present

## 2019-10-30 DIAGNOSIS — Z87892 Personal history of anaphylaxis: Secondary | ICD-10-CM | POA: Diagnosis not present

## 2019-10-30 DIAGNOSIS — R7889 Finding of other specified substances, not normally found in blood: Secondary | ICD-10-CM | POA: Diagnosis not present

## 2019-10-30 DIAGNOSIS — I10 Essential (primary) hypertension: Secondary | ICD-10-CM | POA: Diagnosis not present

## 2019-10-30 DIAGNOSIS — Z79899 Other long term (current) drug therapy: Secondary | ICD-10-CM | POA: Diagnosis not present

## 2019-10-30 DIAGNOSIS — Z91030 Bee allergy status: Secondary | ICD-10-CM | POA: Diagnosis not present

## 2019-11-01 DIAGNOSIS — M79605 Pain in left leg: Secondary | ICD-10-CM | POA: Diagnosis not present

## 2019-11-01 DIAGNOSIS — Z5111 Encounter for antineoplastic chemotherapy: Secondary | ICD-10-CM | POA: Diagnosis not present

## 2019-11-01 DIAGNOSIS — C642 Malignant neoplasm of left kidney, except renal pelvis: Secondary | ICD-10-CM | POA: Diagnosis not present

## 2019-11-01 DIAGNOSIS — I10 Essential (primary) hypertension: Secondary | ICD-10-CM | POA: Diagnosis not present

## 2019-11-01 DIAGNOSIS — Z789 Other specified health status: Secondary | ICD-10-CM | POA: Diagnosis not present

## 2019-11-02 DIAGNOSIS — T63451D Toxic effect of venom of hornets, accidental (unintentional), subsequent encounter: Secondary | ICD-10-CM | POA: Diagnosis not present

## 2019-11-02 DIAGNOSIS — T63461D Toxic effect of venom of wasps, accidental (unintentional), subsequent encounter: Secondary | ICD-10-CM | POA: Diagnosis not present

## 2019-11-03 DIAGNOSIS — C642 Malignant neoplasm of left kidney, except renal pelvis: Secondary | ICD-10-CM | POA: Diagnosis not present

## 2019-11-03 DIAGNOSIS — R590 Localized enlarged lymph nodes: Secondary | ICD-10-CM | POA: Diagnosis not present

## 2019-11-03 DIAGNOSIS — M722 Plantar fascial fibromatosis: Secondary | ICD-10-CM | POA: Diagnosis not present

## 2019-11-03 DIAGNOSIS — K4090 Unilateral inguinal hernia, without obstruction or gangrene, not specified as recurrent: Secondary | ICD-10-CM | POA: Diagnosis not present

## 2019-11-03 DIAGNOSIS — M79672 Pain in left foot: Secondary | ICD-10-CM | POA: Diagnosis not present

## 2019-11-03 DIAGNOSIS — E860 Dehydration: Secondary | ICD-10-CM | POA: Diagnosis not present

## 2019-11-03 DIAGNOSIS — K429 Umbilical hernia without obstruction or gangrene: Secondary | ICD-10-CM | POA: Diagnosis not present

## 2019-11-03 DIAGNOSIS — Z5111 Encounter for antineoplastic chemotherapy: Secondary | ICD-10-CM | POA: Diagnosis not present

## 2019-11-03 DIAGNOSIS — N3289 Other specified disorders of bladder: Secondary | ICD-10-CM | POA: Diagnosis not present

## 2019-11-03 DIAGNOSIS — R1114 Bilious vomiting: Secondary | ICD-10-CM | POA: Diagnosis not present

## 2019-11-03 DIAGNOSIS — N281 Cyst of kidney, acquired: Secondary | ICD-10-CM | POA: Diagnosis not present

## 2019-11-03 DIAGNOSIS — I2510 Atherosclerotic heart disease of native coronary artery without angina pectoris: Secondary | ICD-10-CM | POA: Diagnosis not present

## 2019-11-03 DIAGNOSIS — N1330 Unspecified hydronephrosis: Secondary | ICD-10-CM | POA: Diagnosis not present

## 2019-11-03 DIAGNOSIS — I771 Stricture of artery: Secondary | ICD-10-CM | POA: Diagnosis not present

## 2019-11-04 DIAGNOSIS — Z5112 Encounter for antineoplastic immunotherapy: Secondary | ICD-10-CM | POA: Diagnosis not present

## 2019-11-04 DIAGNOSIS — C642 Malignant neoplasm of left kidney, except renal pelvis: Secondary | ICD-10-CM | POA: Diagnosis not present

## 2019-11-05 DIAGNOSIS — N1330 Unspecified hydronephrosis: Secondary | ICD-10-CM | POA: Diagnosis not present

## 2019-11-05 DIAGNOSIS — C642 Malignant neoplasm of left kidney, except renal pelvis: Secondary | ICD-10-CM | POA: Diagnosis not present

## 2019-11-09 DIAGNOSIS — Z79899 Other long term (current) drug therapy: Secondary | ICD-10-CM | POA: Diagnosis not present

## 2019-11-09 DIAGNOSIS — R109 Unspecified abdominal pain: Secondary | ICD-10-CM | POA: Diagnosis not present

## 2019-11-09 DIAGNOSIS — Z91030 Bee allergy status: Secondary | ICD-10-CM | POA: Diagnosis not present

## 2019-11-09 DIAGNOSIS — R339 Retention of urine, unspecified: Secondary | ICD-10-CM | POA: Diagnosis not present

## 2019-11-09 DIAGNOSIS — N1330 Unspecified hydronephrosis: Secondary | ICD-10-CM | POA: Diagnosis not present

## 2019-11-09 DIAGNOSIS — Z87891 Personal history of nicotine dependence: Secondary | ICD-10-CM | POA: Diagnosis not present

## 2019-11-09 DIAGNOSIS — Z87892 Personal history of anaphylaxis: Secondary | ICD-10-CM | POA: Diagnosis not present

## 2019-11-09 DIAGNOSIS — C642 Malignant neoplasm of left kidney, except renal pelvis: Secondary | ICD-10-CM | POA: Diagnosis not present

## 2019-11-09 DIAGNOSIS — I10 Essential (primary) hypertension: Secondary | ICD-10-CM | POA: Diagnosis not present

## 2019-11-09 DIAGNOSIS — Z960 Presence of urogenital implants: Secondary | ICD-10-CM | POA: Diagnosis not present

## 2019-11-12 DIAGNOSIS — K429 Umbilical hernia without obstruction or gangrene: Secondary | ICD-10-CM | POA: Diagnosis not present

## 2019-11-12 DIAGNOSIS — I745 Embolism and thrombosis of iliac artery: Secondary | ICD-10-CM | POA: Diagnosis not present

## 2019-11-12 DIAGNOSIS — C642 Malignant neoplasm of left kidney, except renal pelvis: Secondary | ICD-10-CM | POA: Diagnosis not present

## 2019-11-12 DIAGNOSIS — I2510 Atherosclerotic heart disease of native coronary artery without angina pectoris: Secondary | ICD-10-CM | POA: Diagnosis not present

## 2019-11-12 DIAGNOSIS — I70203 Unspecified atherosclerosis of native arteries of extremities, bilateral legs: Secondary | ICD-10-CM | POA: Diagnosis not present

## 2019-11-12 DIAGNOSIS — K4090 Unilateral inguinal hernia, without obstruction or gangrene, not specified as recurrent: Secondary | ICD-10-CM | POA: Diagnosis not present

## 2019-11-17 DIAGNOSIS — K429 Umbilical hernia without obstruction or gangrene: Secondary | ICD-10-CM | POA: Diagnosis not present

## 2019-11-17 DIAGNOSIS — N3289 Other specified disorders of bladder: Secondary | ICD-10-CM | POA: Diagnosis not present

## 2019-11-17 DIAGNOSIS — R1114 Bilious vomiting: Secondary | ICD-10-CM | POA: Diagnosis not present

## 2019-11-17 DIAGNOSIS — N281 Cyst of kidney, acquired: Secondary | ICD-10-CM | POA: Diagnosis not present

## 2019-11-17 DIAGNOSIS — N1330 Unspecified hydronephrosis: Secondary | ICD-10-CM | POA: Diagnosis not present

## 2019-11-17 DIAGNOSIS — M722 Plantar fascial fibromatosis: Secondary | ICD-10-CM | POA: Diagnosis not present

## 2019-11-17 DIAGNOSIS — I771 Stricture of artery: Secondary | ICD-10-CM | POA: Diagnosis not present

## 2019-11-17 DIAGNOSIS — I2510 Atherosclerotic heart disease of native coronary artery without angina pectoris: Secondary | ICD-10-CM | POA: Diagnosis not present

## 2019-11-17 DIAGNOSIS — M79672 Pain in left foot: Secondary | ICD-10-CM | POA: Diagnosis not present

## 2019-11-17 DIAGNOSIS — E860 Dehydration: Secondary | ICD-10-CM | POA: Diagnosis not present

## 2019-11-17 DIAGNOSIS — C642 Malignant neoplasm of left kidney, except renal pelvis: Secondary | ICD-10-CM | POA: Diagnosis not present

## 2019-11-17 DIAGNOSIS — Z5111 Encounter for antineoplastic chemotherapy: Secondary | ICD-10-CM | POA: Diagnosis not present

## 2019-11-17 DIAGNOSIS — R590 Localized enlarged lymph nodes: Secondary | ICD-10-CM | POA: Diagnosis not present

## 2019-11-17 DIAGNOSIS — K4090 Unilateral inguinal hernia, without obstruction or gangrene, not specified as recurrent: Secondary | ICD-10-CM | POA: Diagnosis not present

## 2019-11-19 DIAGNOSIS — C642 Malignant neoplasm of left kidney, except renal pelvis: Secondary | ICD-10-CM | POA: Diagnosis not present

## 2019-11-19 DIAGNOSIS — Z5111 Encounter for antineoplastic chemotherapy: Secondary | ICD-10-CM | POA: Diagnosis not present

## 2019-11-22 DIAGNOSIS — I2510 Atherosclerotic heart disease of native coronary artery without angina pectoris: Secondary | ICD-10-CM | POA: Diagnosis not present

## 2019-11-22 DIAGNOSIS — C642 Malignant neoplasm of left kidney, except renal pelvis: Secondary | ICD-10-CM | POA: Diagnosis not present

## 2019-11-22 DIAGNOSIS — M79672 Pain in left foot: Secondary | ICD-10-CM | POA: Diagnosis not present

## 2019-11-22 DIAGNOSIS — N1330 Unspecified hydronephrosis: Secondary | ICD-10-CM | POA: Diagnosis not present

## 2019-11-22 DIAGNOSIS — I771 Stricture of artery: Secondary | ICD-10-CM | POA: Diagnosis not present

## 2019-11-22 DIAGNOSIS — M722 Plantar fascial fibromatosis: Secondary | ICD-10-CM | POA: Diagnosis not present

## 2019-11-22 DIAGNOSIS — N3289 Other specified disorders of bladder: Secondary | ICD-10-CM | POA: Diagnosis not present

## 2019-11-22 DIAGNOSIS — R1114 Bilious vomiting: Secondary | ICD-10-CM | POA: Diagnosis not present

## 2019-11-22 DIAGNOSIS — K429 Umbilical hernia without obstruction or gangrene: Secondary | ICD-10-CM | POA: Diagnosis not present

## 2019-11-22 DIAGNOSIS — E860 Dehydration: Secondary | ICD-10-CM | POA: Diagnosis not present

## 2019-11-22 DIAGNOSIS — R590 Localized enlarged lymph nodes: Secondary | ICD-10-CM | POA: Diagnosis not present

## 2019-11-22 DIAGNOSIS — Z5111 Encounter for antineoplastic chemotherapy: Secondary | ICD-10-CM | POA: Diagnosis not present

## 2019-11-22 DIAGNOSIS — N281 Cyst of kidney, acquired: Secondary | ICD-10-CM | POA: Diagnosis not present

## 2019-11-22 DIAGNOSIS — K4090 Unilateral inguinal hernia, without obstruction or gangrene, not specified as recurrent: Secondary | ICD-10-CM | POA: Diagnosis not present

## 2019-11-23 DIAGNOSIS — N281 Cyst of kidney, acquired: Secondary | ICD-10-CM | POA: Diagnosis not present

## 2019-11-23 DIAGNOSIS — Z5111 Encounter for antineoplastic chemotherapy: Secondary | ICD-10-CM | POA: Diagnosis not present

## 2019-11-23 DIAGNOSIS — I771 Stricture of artery: Secondary | ICD-10-CM | POA: Diagnosis not present

## 2019-11-23 DIAGNOSIS — I2510 Atherosclerotic heart disease of native coronary artery without angina pectoris: Secondary | ICD-10-CM | POA: Diagnosis not present

## 2019-11-23 DIAGNOSIS — K429 Umbilical hernia without obstruction or gangrene: Secondary | ICD-10-CM | POA: Diagnosis not present

## 2019-11-23 DIAGNOSIS — M722 Plantar fascial fibromatosis: Secondary | ICD-10-CM | POA: Diagnosis not present

## 2019-11-23 DIAGNOSIS — N1330 Unspecified hydronephrosis: Secondary | ICD-10-CM | POA: Diagnosis not present

## 2019-11-23 DIAGNOSIS — M79672 Pain in left foot: Secondary | ICD-10-CM | POA: Diagnosis not present

## 2019-11-23 DIAGNOSIS — K4090 Unilateral inguinal hernia, without obstruction or gangrene, not specified as recurrent: Secondary | ICD-10-CM | POA: Diagnosis not present

## 2019-11-23 DIAGNOSIS — C642 Malignant neoplasm of left kidney, except renal pelvis: Secondary | ICD-10-CM | POA: Diagnosis not present

## 2019-11-23 DIAGNOSIS — R1114 Bilious vomiting: Secondary | ICD-10-CM | POA: Diagnosis not present

## 2019-11-23 DIAGNOSIS — N3289 Other specified disorders of bladder: Secondary | ICD-10-CM | POA: Diagnosis not present

## 2019-11-23 DIAGNOSIS — R590 Localized enlarged lymph nodes: Secondary | ICD-10-CM | POA: Diagnosis not present

## 2019-11-23 DIAGNOSIS — E860 Dehydration: Secondary | ICD-10-CM | POA: Diagnosis not present

## 2019-11-24 DIAGNOSIS — C642 Malignant neoplasm of left kidney, except renal pelvis: Secondary | ICD-10-CM | POA: Diagnosis not present

## 2019-11-24 DIAGNOSIS — Z5111 Encounter for antineoplastic chemotherapy: Secondary | ICD-10-CM | POA: Diagnosis not present

## 2019-11-25 DIAGNOSIS — R339 Retention of urine, unspecified: Secondary | ICD-10-CM | POA: Diagnosis not present

## 2019-12-08 DIAGNOSIS — Z5111 Encounter for antineoplastic chemotherapy: Secondary | ICD-10-CM | POA: Diagnosis not present

## 2019-12-08 DIAGNOSIS — N3289 Other specified disorders of bladder: Secondary | ICD-10-CM | POA: Diagnosis not present

## 2019-12-08 DIAGNOSIS — C642 Malignant neoplasm of left kidney, except renal pelvis: Secondary | ICD-10-CM | POA: Diagnosis not present

## 2019-12-08 DIAGNOSIS — I739 Peripheral vascular disease, unspecified: Secondary | ICD-10-CM | POA: Diagnosis not present

## 2019-12-08 DIAGNOSIS — K429 Umbilical hernia without obstruction or gangrene: Secondary | ICD-10-CM | POA: Diagnosis not present

## 2019-12-08 DIAGNOSIS — I2510 Atherosclerotic heart disease of native coronary artery without angina pectoris: Secondary | ICD-10-CM | POA: Diagnosis not present

## 2019-12-08 DIAGNOSIS — Z7901 Long term (current) use of anticoagulants: Secondary | ICD-10-CM | POA: Diagnosis not present

## 2019-12-08 DIAGNOSIS — N1330 Unspecified hydronephrosis: Secondary | ICD-10-CM | POA: Diagnosis not present

## 2019-12-08 DIAGNOSIS — I70213 Atherosclerosis of native arteries of extremities with intermittent claudication, bilateral legs: Secondary | ICD-10-CM | POA: Diagnosis not present

## 2019-12-08 DIAGNOSIS — K4090 Unilateral inguinal hernia, without obstruction or gangrene, not specified as recurrent: Secondary | ICD-10-CM | POA: Diagnosis not present

## 2019-12-08 DIAGNOSIS — R590 Localized enlarged lymph nodes: Secondary | ICD-10-CM | POA: Diagnosis not present

## 2019-12-10 DIAGNOSIS — C642 Malignant neoplasm of left kidney, except renal pelvis: Secondary | ICD-10-CM | POA: Diagnosis not present

## 2019-12-10 DIAGNOSIS — Z7901 Long term (current) use of anticoagulants: Secondary | ICD-10-CM | POA: Diagnosis not present

## 2019-12-10 DIAGNOSIS — R590 Localized enlarged lymph nodes: Secondary | ICD-10-CM | POA: Diagnosis not present

## 2019-12-10 DIAGNOSIS — I70213 Atherosclerosis of native arteries of extremities with intermittent claudication, bilateral legs: Secondary | ICD-10-CM | POA: Diagnosis not present

## 2019-12-10 DIAGNOSIS — K4090 Unilateral inguinal hernia, without obstruction or gangrene, not specified as recurrent: Secondary | ICD-10-CM | POA: Diagnosis not present

## 2019-12-10 DIAGNOSIS — K429 Umbilical hernia without obstruction or gangrene: Secondary | ICD-10-CM | POA: Diagnosis not present

## 2019-12-10 DIAGNOSIS — N1330 Unspecified hydronephrosis: Secondary | ICD-10-CM | POA: Diagnosis not present

## 2019-12-10 DIAGNOSIS — N3289 Other specified disorders of bladder: Secondary | ICD-10-CM | POA: Diagnosis not present

## 2019-12-10 DIAGNOSIS — I2510 Atherosclerotic heart disease of native coronary artery without angina pectoris: Secondary | ICD-10-CM | POA: Diagnosis not present

## 2019-12-10 DIAGNOSIS — Z5111 Encounter for antineoplastic chemotherapy: Secondary | ICD-10-CM | POA: Diagnosis not present

## 2019-12-13 DIAGNOSIS — C642 Malignant neoplasm of left kidney, except renal pelvis: Secondary | ICD-10-CM | POA: Diagnosis not present

## 2019-12-13 DIAGNOSIS — T63461D Toxic effect of venom of wasps, accidental (unintentional), subsequent encounter: Secondary | ICD-10-CM | POA: Diagnosis not present

## 2019-12-13 DIAGNOSIS — T63451D Toxic effect of venom of hornets, accidental (unintentional), subsequent encounter: Secondary | ICD-10-CM | POA: Diagnosis not present

## 2019-12-15 DIAGNOSIS — I2510 Atherosclerotic heart disease of native coronary artery without angina pectoris: Secondary | ICD-10-CM | POA: Diagnosis not present

## 2019-12-15 DIAGNOSIS — N1330 Unspecified hydronephrosis: Secondary | ICD-10-CM | POA: Diagnosis not present

## 2019-12-15 DIAGNOSIS — Z5111 Encounter for antineoplastic chemotherapy: Secondary | ICD-10-CM | POA: Diagnosis not present

## 2019-12-15 DIAGNOSIS — K4090 Unilateral inguinal hernia, without obstruction or gangrene, not specified as recurrent: Secondary | ICD-10-CM | POA: Diagnosis not present

## 2019-12-15 DIAGNOSIS — N3289 Other specified disorders of bladder: Secondary | ICD-10-CM | POA: Diagnosis not present

## 2019-12-15 DIAGNOSIS — I70213 Atherosclerosis of native arteries of extremities with intermittent claudication, bilateral legs: Secondary | ICD-10-CM | POA: Diagnosis not present

## 2019-12-15 DIAGNOSIS — Z7901 Long term (current) use of anticoagulants: Secondary | ICD-10-CM | POA: Diagnosis not present

## 2019-12-15 DIAGNOSIS — R590 Localized enlarged lymph nodes: Secondary | ICD-10-CM | POA: Diagnosis not present

## 2019-12-15 DIAGNOSIS — K429 Umbilical hernia without obstruction or gangrene: Secondary | ICD-10-CM | POA: Diagnosis not present

## 2019-12-15 DIAGNOSIS — C642 Malignant neoplasm of left kidney, except renal pelvis: Secondary | ICD-10-CM | POA: Diagnosis not present

## 2019-12-17 DIAGNOSIS — C642 Malignant neoplasm of left kidney, except renal pelvis: Secondary | ICD-10-CM | POA: Diagnosis not present

## 2019-12-24 DIAGNOSIS — K7689 Other specified diseases of liver: Secondary | ICD-10-CM | POA: Diagnosis not present

## 2019-12-24 DIAGNOSIS — N1330 Unspecified hydronephrosis: Secondary | ICD-10-CM | POA: Diagnosis not present

## 2019-12-24 DIAGNOSIS — N3289 Other specified disorders of bladder: Secondary | ICD-10-CM | POA: Diagnosis not present

## 2019-12-24 DIAGNOSIS — N281 Cyst of kidney, acquired: Secondary | ICD-10-CM | POA: Diagnosis not present

## 2019-12-24 DIAGNOSIS — K5730 Diverticulosis of large intestine without perforation or abscess without bleeding: Secondary | ICD-10-CM | POA: Diagnosis not present

## 2019-12-24 DIAGNOSIS — C642 Malignant neoplasm of left kidney, except renal pelvis: Secondary | ICD-10-CM | POA: Diagnosis not present

## 2019-12-24 DIAGNOSIS — R590 Localized enlarged lymph nodes: Secondary | ICD-10-CM | POA: Diagnosis not present

## 2019-12-29 DIAGNOSIS — C642 Malignant neoplasm of left kidney, except renal pelvis: Secondary | ICD-10-CM | POA: Diagnosis not present

## 2019-12-29 DIAGNOSIS — Z5111 Encounter for antineoplastic chemotherapy: Secondary | ICD-10-CM | POA: Diagnosis not present

## 2019-12-29 DIAGNOSIS — K5790 Diverticulosis of intestine, part unspecified, without perforation or abscess without bleeding: Secondary | ICD-10-CM | POA: Diagnosis not present

## 2019-12-29 DIAGNOSIS — N2889 Other specified disorders of kidney and ureter: Secondary | ICD-10-CM | POA: Diagnosis not present

## 2019-12-29 DIAGNOSIS — N281 Cyst of kidney, acquired: Secondary | ICD-10-CM | POA: Diagnosis not present

## 2019-12-29 DIAGNOSIS — K4090 Unilateral inguinal hernia, without obstruction or gangrene, not specified as recurrent: Secondary | ICD-10-CM | POA: Diagnosis not present

## 2019-12-29 DIAGNOSIS — K5730 Diverticulosis of large intestine without perforation or abscess without bleeding: Secondary | ICD-10-CM | POA: Diagnosis not present

## 2019-12-29 DIAGNOSIS — N3289 Other specified disorders of bladder: Secondary | ICD-10-CM | POA: Diagnosis not present

## 2019-12-29 DIAGNOSIS — K429 Umbilical hernia without obstruction or gangrene: Secondary | ICD-10-CM | POA: Diagnosis not present

## 2019-12-29 DIAGNOSIS — I771 Stricture of artery: Secondary | ICD-10-CM | POA: Diagnosis not present

## 2019-12-29 DIAGNOSIS — I2510 Atherosclerotic heart disease of native coronary artery without angina pectoris: Secondary | ICD-10-CM | POA: Diagnosis not present

## 2019-12-29 DIAGNOSIS — I739 Peripheral vascular disease, unspecified: Secondary | ICD-10-CM | POA: Diagnosis not present

## 2019-12-29 DIAGNOSIS — R590 Localized enlarged lymph nodes: Secondary | ICD-10-CM | POA: Diagnosis not present

## 2019-12-29 DIAGNOSIS — N1330 Unspecified hydronephrosis: Secondary | ICD-10-CM | POA: Diagnosis not present

## 2019-12-31 DIAGNOSIS — C642 Malignant neoplasm of left kidney, except renal pelvis: Secondary | ICD-10-CM | POA: Diagnosis not present

## 2019-12-31 DIAGNOSIS — Z5112 Encounter for antineoplastic immunotherapy: Secondary | ICD-10-CM | POA: Diagnosis not present

## 2020-01-03 DIAGNOSIS — C642 Malignant neoplasm of left kidney, except renal pelvis: Secondary | ICD-10-CM | POA: Diagnosis not present

## 2020-01-03 DIAGNOSIS — Z5112 Encounter for antineoplastic immunotherapy: Secondary | ICD-10-CM | POA: Diagnosis not present

## 2020-01-04 DIAGNOSIS — R8281 Pyuria: Secondary | ICD-10-CM | POA: Diagnosis not present

## 2020-01-04 DIAGNOSIS — R339 Retention of urine, unspecified: Secondary | ICD-10-CM | POA: Diagnosis not present

## 2020-01-04 DIAGNOSIS — C642 Malignant neoplasm of left kidney, except renal pelvis: Secondary | ICD-10-CM | POA: Diagnosis not present

## 2020-01-05 DIAGNOSIS — K5790 Diverticulosis of intestine, part unspecified, without perforation or abscess without bleeding: Secondary | ICD-10-CM | POA: Diagnosis not present

## 2020-01-05 DIAGNOSIS — I771 Stricture of artery: Secondary | ICD-10-CM | POA: Diagnosis not present

## 2020-01-05 DIAGNOSIS — C642 Malignant neoplasm of left kidney, except renal pelvis: Secondary | ICD-10-CM | POA: Diagnosis not present

## 2020-01-05 DIAGNOSIS — N281 Cyst of kidney, acquired: Secondary | ICD-10-CM | POA: Diagnosis not present

## 2020-01-05 DIAGNOSIS — I2510 Atherosclerotic heart disease of native coronary artery without angina pectoris: Secondary | ICD-10-CM | POA: Diagnosis not present

## 2020-01-05 DIAGNOSIS — N1330 Unspecified hydronephrosis: Secondary | ICD-10-CM | POA: Diagnosis not present

## 2020-01-05 DIAGNOSIS — K4090 Unilateral inguinal hernia, without obstruction or gangrene, not specified as recurrent: Secondary | ICD-10-CM | POA: Diagnosis not present

## 2020-01-05 DIAGNOSIS — N3289 Other specified disorders of bladder: Secondary | ICD-10-CM | POA: Diagnosis not present

## 2020-01-05 DIAGNOSIS — K429 Umbilical hernia without obstruction or gangrene: Secondary | ICD-10-CM | POA: Diagnosis not present

## 2020-01-05 DIAGNOSIS — Z5111 Encounter for antineoplastic chemotherapy: Secondary | ICD-10-CM | POA: Diagnosis not present

## 2020-01-05 DIAGNOSIS — N2889 Other specified disorders of kidney and ureter: Secondary | ICD-10-CM | POA: Diagnosis not present

## 2020-01-05 DIAGNOSIS — R590 Localized enlarged lymph nodes: Secondary | ICD-10-CM | POA: Diagnosis not present

## 2020-01-06 DIAGNOSIS — C642 Malignant neoplasm of left kidney, except renal pelvis: Secondary | ICD-10-CM | POA: Diagnosis not present

## 2020-01-06 DIAGNOSIS — Z5112 Encounter for antineoplastic immunotherapy: Secondary | ICD-10-CM | POA: Diagnosis not present

## 2020-01-11 DIAGNOSIS — I2510 Atherosclerotic heart disease of native coronary artery without angina pectoris: Secondary | ICD-10-CM | POA: Diagnosis not present

## 2020-01-11 DIAGNOSIS — C642 Malignant neoplasm of left kidney, except renal pelvis: Secondary | ICD-10-CM | POA: Diagnosis not present

## 2020-01-11 DIAGNOSIS — I739 Peripheral vascular disease, unspecified: Secondary | ICD-10-CM | POA: Diagnosis not present

## 2020-01-11 DIAGNOSIS — I10 Essential (primary) hypertension: Secondary | ICD-10-CM | POA: Diagnosis not present

## 2020-01-11 DIAGNOSIS — Z01812 Encounter for preprocedural laboratory examination: Secondary | ICD-10-CM | POA: Diagnosis not present

## 2020-01-11 DIAGNOSIS — J449 Chronic obstructive pulmonary disease, unspecified: Secondary | ICD-10-CM | POA: Diagnosis not present

## 2020-01-11 DIAGNOSIS — Z01818 Encounter for other preprocedural examination: Secondary | ICD-10-CM | POA: Diagnosis not present

## 2020-01-14 DIAGNOSIS — K429 Umbilical hernia without obstruction or gangrene: Secondary | ICD-10-CM | POA: Diagnosis not present

## 2020-01-14 DIAGNOSIS — C642 Malignant neoplasm of left kidney, except renal pelvis: Secondary | ICD-10-CM | POA: Diagnosis not present

## 2020-01-14 DIAGNOSIS — I82402 Acute embolism and thrombosis of unspecified deep veins of left lower extremity: Secondary | ICD-10-CM | POA: Diagnosis not present

## 2020-01-17 DIAGNOSIS — T63451D Toxic effect of venom of hornets, accidental (unintentional), subsequent encounter: Secondary | ICD-10-CM | POA: Diagnosis not present

## 2020-01-17 DIAGNOSIS — T63461D Toxic effect of venom of wasps, accidental (unintentional), subsequent encounter: Secondary | ICD-10-CM | POA: Diagnosis not present

## 2020-01-19 DIAGNOSIS — K429 Umbilical hernia without obstruction or gangrene: Secondary | ICD-10-CM | POA: Diagnosis not present

## 2020-01-19 DIAGNOSIS — I2510 Atherosclerotic heart disease of native coronary artery without angina pectoris: Secondary | ICD-10-CM | POA: Diagnosis not present

## 2020-01-19 DIAGNOSIS — Z79899 Other long term (current) drug therapy: Secondary | ICD-10-CM | POA: Diagnosis not present

## 2020-01-19 DIAGNOSIS — N390 Urinary tract infection, site not specified: Secondary | ICD-10-CM | POA: Diagnosis not present

## 2020-01-19 DIAGNOSIS — C772 Secondary and unspecified malignant neoplasm of intra-abdominal lymph nodes: Secondary | ICD-10-CM | POA: Diagnosis not present

## 2020-01-19 DIAGNOSIS — I10 Essential (primary) hypertension: Secondary | ICD-10-CM | POA: Diagnosis not present

## 2020-01-19 DIAGNOSIS — C642 Malignant neoplasm of left kidney, except renal pelvis: Secondary | ICD-10-CM | POA: Diagnosis not present

## 2020-01-19 DIAGNOSIS — Z87891 Personal history of nicotine dependence: Secondary | ICD-10-CM | POA: Diagnosis not present

## 2020-01-19 DIAGNOSIS — M1990 Unspecified osteoarthritis, unspecified site: Secondary | ICD-10-CM | POA: Diagnosis not present

## 2020-01-19 DIAGNOSIS — J449 Chronic obstructive pulmonary disease, unspecified: Secondary | ICD-10-CM | POA: Diagnosis not present

## 2020-01-19 DIAGNOSIS — Z9221 Personal history of antineoplastic chemotherapy: Secondary | ICD-10-CM | POA: Diagnosis not present

## 2020-01-19 DIAGNOSIS — N2889 Other specified disorders of kidney and ureter: Secondary | ICD-10-CM | POA: Diagnosis not present

## 2020-01-19 DIAGNOSIS — I82402 Acute embolism and thrombosis of unspecified deep veins of left lower extremity: Secondary | ICD-10-CM | POA: Diagnosis not present

## 2020-01-19 DIAGNOSIS — C652 Malignant neoplasm of left renal pelvis: Secondary | ICD-10-CM | POA: Diagnosis not present

## 2020-01-19 DIAGNOSIS — R590 Localized enlarged lymph nodes: Secondary | ICD-10-CM | POA: Diagnosis not present

## 2020-01-19 DIAGNOSIS — I739 Peripheral vascular disease, unspecified: Secondary | ICD-10-CM | POA: Diagnosis not present

## 2020-01-19 DIAGNOSIS — Z7901 Long term (current) use of anticoagulants: Secondary | ICD-10-CM | POA: Diagnosis not present

## 2020-01-31 DIAGNOSIS — N2889 Other specified disorders of kidney and ureter: Secondary | ICD-10-CM | POA: Diagnosis not present

## 2020-02-03 DIAGNOSIS — N2889 Other specified disorders of kidney and ureter: Secondary | ICD-10-CM | POA: Diagnosis not present

## 2020-02-03 DIAGNOSIS — I70213 Atherosclerosis of native arteries of extremities with intermittent claudication, bilateral legs: Secondary | ICD-10-CM | POA: Diagnosis not present

## 2020-02-03 DIAGNOSIS — D649 Anemia, unspecified: Secondary | ICD-10-CM | POA: Diagnosis not present

## 2020-02-03 DIAGNOSIS — N1831 Chronic kidney disease, stage 3a: Secondary | ICD-10-CM | POA: Diagnosis not present

## 2020-02-03 DIAGNOSIS — R590 Localized enlarged lymph nodes: Secondary | ICD-10-CM | POA: Diagnosis not present

## 2020-02-03 DIAGNOSIS — C642 Malignant neoplasm of left kidney, except renal pelvis: Secondary | ICD-10-CM | POA: Diagnosis not present

## 2020-02-03 DIAGNOSIS — K429 Umbilical hernia without obstruction or gangrene: Secondary | ICD-10-CM | POA: Diagnosis not present

## 2020-02-03 DIAGNOSIS — N1330 Unspecified hydronephrosis: Secondary | ICD-10-CM | POA: Diagnosis not present

## 2020-02-03 DIAGNOSIS — I739 Peripheral vascular disease, unspecified: Secondary | ICD-10-CM | POA: Diagnosis not present

## 2020-02-03 DIAGNOSIS — Z5112 Encounter for antineoplastic immunotherapy: Secondary | ICD-10-CM | POA: Diagnosis not present

## 2020-02-03 DIAGNOSIS — K4090 Unilateral inguinal hernia, without obstruction or gangrene, not specified as recurrent: Secondary | ICD-10-CM | POA: Diagnosis not present

## 2020-02-03 DIAGNOSIS — I771 Stricture of artery: Secondary | ICD-10-CM | POA: Diagnosis not present

## 2020-02-03 DIAGNOSIS — I82402 Acute embolism and thrombosis of unspecified deep veins of left lower extremity: Secondary | ICD-10-CM | POA: Diagnosis not present

## 2020-02-07 DIAGNOSIS — N2889 Other specified disorders of kidney and ureter: Secondary | ICD-10-CM | POA: Diagnosis not present

## 2020-02-07 DIAGNOSIS — I70213 Atherosclerosis of native arteries of extremities with intermittent claudication, bilateral legs: Secondary | ICD-10-CM | POA: Diagnosis not present

## 2020-02-07 DIAGNOSIS — N1831 Chronic kidney disease, stage 3a: Secondary | ICD-10-CM | POA: Diagnosis not present

## 2020-02-07 DIAGNOSIS — K429 Umbilical hernia without obstruction or gangrene: Secondary | ICD-10-CM | POA: Diagnosis not present

## 2020-02-07 DIAGNOSIS — R590 Localized enlarged lymph nodes: Secondary | ICD-10-CM | POA: Diagnosis not present

## 2020-02-07 DIAGNOSIS — Z5112 Encounter for antineoplastic immunotherapy: Secondary | ICD-10-CM | POA: Diagnosis not present

## 2020-02-07 DIAGNOSIS — I82402 Acute embolism and thrombosis of unspecified deep veins of left lower extremity: Secondary | ICD-10-CM | POA: Diagnosis not present

## 2020-02-07 DIAGNOSIS — N1330 Unspecified hydronephrosis: Secondary | ICD-10-CM | POA: Diagnosis not present

## 2020-02-07 DIAGNOSIS — D649 Anemia, unspecified: Secondary | ICD-10-CM | POA: Diagnosis not present

## 2020-02-07 DIAGNOSIS — C642 Malignant neoplasm of left kidney, except renal pelvis: Secondary | ICD-10-CM | POA: Diagnosis not present

## 2020-02-07 DIAGNOSIS — I771 Stricture of artery: Secondary | ICD-10-CM | POA: Diagnosis not present

## 2020-02-07 DIAGNOSIS — K4090 Unilateral inguinal hernia, without obstruction or gangrene, not specified as recurrent: Secondary | ICD-10-CM | POA: Diagnosis not present

## 2020-02-11 DIAGNOSIS — C652 Malignant neoplasm of left renal pelvis: Secondary | ICD-10-CM | POA: Diagnosis not present

## 2020-02-11 DIAGNOSIS — R339 Retention of urine, unspecified: Secondary | ICD-10-CM | POA: Diagnosis not present

## 2020-02-21 DIAGNOSIS — R590 Localized enlarged lymph nodes: Secondary | ICD-10-CM | POA: Diagnosis not present

## 2020-02-21 DIAGNOSIS — K429 Umbilical hernia without obstruction or gangrene: Secondary | ICD-10-CM | POA: Diagnosis not present

## 2020-02-21 DIAGNOSIS — D649 Anemia, unspecified: Secondary | ICD-10-CM | POA: Diagnosis not present

## 2020-02-21 DIAGNOSIS — N1831 Chronic kidney disease, stage 3a: Secondary | ICD-10-CM | POA: Diagnosis not present

## 2020-02-21 DIAGNOSIS — I70213 Atherosclerosis of native arteries of extremities with intermittent claudication, bilateral legs: Secondary | ICD-10-CM | POA: Diagnosis not present

## 2020-02-21 DIAGNOSIS — K4090 Unilateral inguinal hernia, without obstruction or gangrene, not specified as recurrent: Secondary | ICD-10-CM | POA: Diagnosis not present

## 2020-02-21 DIAGNOSIS — C642 Malignant neoplasm of left kidney, except renal pelvis: Secondary | ICD-10-CM | POA: Diagnosis not present

## 2020-02-21 DIAGNOSIS — I739 Peripheral vascular disease, unspecified: Secondary | ICD-10-CM | POA: Diagnosis not present

## 2020-02-21 DIAGNOSIS — I82402 Acute embolism and thrombosis of unspecified deep veins of left lower extremity: Secondary | ICD-10-CM | POA: Diagnosis not present

## 2020-02-21 DIAGNOSIS — Z5112 Encounter for antineoplastic immunotherapy: Secondary | ICD-10-CM | POA: Diagnosis not present

## 2020-02-21 DIAGNOSIS — I771 Stricture of artery: Secondary | ICD-10-CM | POA: Diagnosis not present

## 2020-02-21 DIAGNOSIS — N2889 Other specified disorders of kidney and ureter: Secondary | ICD-10-CM | POA: Diagnosis not present

## 2020-02-21 DIAGNOSIS — N1330 Unspecified hydronephrosis: Secondary | ICD-10-CM | POA: Diagnosis not present

## 2020-02-22 DIAGNOSIS — Z0120 Encounter for dental examination and cleaning without abnormal findings: Secondary | ICD-10-CM | POA: Diagnosis not present

## 2020-02-28 DIAGNOSIS — T63451D Toxic effect of venom of hornets, accidental (unintentional), subsequent encounter: Secondary | ICD-10-CM | POA: Diagnosis not present

## 2020-02-28 DIAGNOSIS — T63461D Toxic effect of venom of wasps, accidental (unintentional), subsequent encounter: Secondary | ICD-10-CM | POA: Diagnosis not present

## 2020-02-29 DIAGNOSIS — C689 Malignant neoplasm of urinary organ, unspecified: Secondary | ICD-10-CM | POA: Diagnosis not present

## 2020-02-29 DIAGNOSIS — N139 Obstructive and reflux uropathy, unspecified: Secondary | ICD-10-CM | POA: Diagnosis not present

## 2020-02-29 DIAGNOSIS — Z0000 Encounter for general adult medical examination without abnormal findings: Secondary | ICD-10-CM | POA: Diagnosis not present

## 2020-02-29 DIAGNOSIS — Z978 Presence of other specified devices: Secondary | ICD-10-CM | POA: Diagnosis not present

## 2020-03-03 DIAGNOSIS — R339 Retention of urine, unspecified: Secondary | ICD-10-CM | POA: Diagnosis not present

## 2020-03-03 DIAGNOSIS — N1330 Unspecified hydronephrosis: Secondary | ICD-10-CM | POA: Diagnosis not present

## 2020-03-06 DIAGNOSIS — R1084 Generalized abdominal pain: Secondary | ICD-10-CM | POA: Diagnosis not present

## 2020-03-06 DIAGNOSIS — C642 Malignant neoplasm of left kidney, except renal pelvis: Secondary | ICD-10-CM | POA: Diagnosis not present

## 2020-03-06 DIAGNOSIS — I739 Peripheral vascular disease, unspecified: Secondary | ICD-10-CM | POA: Diagnosis not present

## 2020-03-06 DIAGNOSIS — N1330 Unspecified hydronephrosis: Secondary | ICD-10-CM | POA: Diagnosis not present

## 2020-03-06 DIAGNOSIS — M545 Low back pain: Secondary | ICD-10-CM | POA: Diagnosis not present

## 2020-03-08 DIAGNOSIS — N1330 Unspecified hydronephrosis: Secondary | ICD-10-CM | POA: Diagnosis not present

## 2020-03-22 DIAGNOSIS — I739 Peripheral vascular disease, unspecified: Secondary | ICD-10-CM | POA: Diagnosis not present

## 2020-03-22 DIAGNOSIS — K429 Umbilical hernia without obstruction or gangrene: Secondary | ICD-10-CM | POA: Diagnosis not present

## 2020-03-22 DIAGNOSIS — N1330 Unspecified hydronephrosis: Secondary | ICD-10-CM | POA: Diagnosis not present

## 2020-03-22 DIAGNOSIS — K4090 Unilateral inguinal hernia, without obstruction or gangrene, not specified as recurrent: Secondary | ICD-10-CM | POA: Diagnosis not present

## 2020-03-22 DIAGNOSIS — M79672 Pain in left foot: Secondary | ICD-10-CM | POA: Diagnosis not present

## 2020-03-22 DIAGNOSIS — M545 Low back pain: Secondary | ICD-10-CM | POA: Diagnosis not present

## 2020-03-22 DIAGNOSIS — R1084 Generalized abdominal pain: Secondary | ICD-10-CM | POA: Diagnosis not present

## 2020-03-22 DIAGNOSIS — C642 Malignant neoplasm of left kidney, except renal pelvis: Secondary | ICD-10-CM | POA: Diagnosis not present

## 2020-03-27 DIAGNOSIS — G459 Transient cerebral ischemic attack, unspecified: Secondary | ICD-10-CM | POA: Diagnosis not present

## 2020-03-27 DIAGNOSIS — Z87891 Personal history of nicotine dependence: Secondary | ICD-10-CM | POA: Diagnosis not present

## 2020-03-27 DIAGNOSIS — Z7901 Long term (current) use of anticoagulants: Secondary | ICD-10-CM | POA: Diagnosis not present

## 2020-03-27 DIAGNOSIS — Z86718 Personal history of other venous thrombosis and embolism: Secondary | ICD-10-CM | POA: Diagnosis not present

## 2020-03-27 DIAGNOSIS — Z79899 Other long term (current) drug therapy: Secondary | ICD-10-CM | POA: Diagnosis not present

## 2020-03-27 DIAGNOSIS — I10 Essential (primary) hypertension: Secondary | ICD-10-CM | POA: Diagnosis not present

## 2020-03-27 DIAGNOSIS — I639 Cerebral infarction, unspecified: Secondary | ICD-10-CM | POA: Diagnosis not present

## 2020-03-27 DIAGNOSIS — C642 Malignant neoplasm of left kidney, except renal pelvis: Secondary | ICD-10-CM | POA: Diagnosis not present

## 2020-03-27 DIAGNOSIS — R410 Disorientation, unspecified: Secondary | ICD-10-CM | POA: Diagnosis not present

## 2020-03-27 DIAGNOSIS — Z905 Acquired absence of kidney: Secondary | ICD-10-CM | POA: Diagnosis not present

## 2020-03-27 DIAGNOSIS — Z7982 Long term (current) use of aspirin: Secondary | ICD-10-CM | POA: Diagnosis not present

## 2020-03-27 DIAGNOSIS — I63233 Cerebral infarction due to unspecified occlusion or stenosis of bilateral carotid arteries: Secondary | ICD-10-CM | POA: Diagnosis not present

## 2020-03-27 DIAGNOSIS — R8281 Pyuria: Secondary | ICD-10-CM | POA: Diagnosis not present

## 2020-03-27 DIAGNOSIS — R4781 Slurred speech: Secondary | ICD-10-CM | POA: Diagnosis not present

## 2020-03-27 DIAGNOSIS — J449 Chronic obstructive pulmonary disease, unspecified: Secondary | ICD-10-CM | POA: Diagnosis not present

## 2020-03-27 DIAGNOSIS — I739 Peripheral vascular disease, unspecified: Secondary | ICD-10-CM | POA: Diagnosis not present

## 2020-03-27 DIAGNOSIS — R29810 Facial weakness: Secondary | ICD-10-CM | POA: Diagnosis not present

## 2020-03-28 DIAGNOSIS — I340 Nonrheumatic mitral (valve) insufficiency: Secondary | ICD-10-CM | POA: Diagnosis not present

## 2020-03-28 DIAGNOSIS — I63233 Cerebral infarction due to unspecified occlusion or stenosis of bilateral carotid arteries: Secondary | ICD-10-CM | POA: Diagnosis not present

## 2020-03-28 DIAGNOSIS — R9082 White matter disease, unspecified: Secondary | ICD-10-CM | POA: Diagnosis not present

## 2020-03-28 DIAGNOSIS — G459 Transient cerebral ischemic attack, unspecified: Secondary | ICD-10-CM | POA: Diagnosis not present

## 2020-03-28 DIAGNOSIS — I6523 Occlusion and stenosis of bilateral carotid arteries: Secondary | ICD-10-CM | POA: Diagnosis not present

## 2020-03-28 DIAGNOSIS — R8281 Pyuria: Secondary | ICD-10-CM | POA: Diagnosis not present

## 2020-03-28 DIAGNOSIS — I10 Essential (primary) hypertension: Secondary | ICD-10-CM | POA: Diagnosis not present

## 2020-03-28 DIAGNOSIS — G319 Degenerative disease of nervous system, unspecified: Secondary | ICD-10-CM | POA: Diagnosis not present

## 2020-03-28 DIAGNOSIS — I77810 Thoracic aortic ectasia: Secondary | ICD-10-CM | POA: Diagnosis not present

## 2020-03-28 DIAGNOSIS — C642 Malignant neoplasm of left kidney, except renal pelvis: Secondary | ICD-10-CM | POA: Diagnosis not present

## 2020-03-29 DIAGNOSIS — I10 Essential (primary) hypertension: Secondary | ICD-10-CM | POA: Diagnosis not present

## 2020-03-29 DIAGNOSIS — G459 Transient cerebral ischemic attack, unspecified: Secondary | ICD-10-CM | POA: Diagnosis not present

## 2020-03-29 DIAGNOSIS — R339 Retention of urine, unspecified: Secondary | ICD-10-CM | POA: Diagnosis not present

## 2020-03-29 DIAGNOSIS — J449 Chronic obstructive pulmonary disease, unspecified: Secondary | ICD-10-CM | POA: Diagnosis not present

## 2020-03-29 DIAGNOSIS — Z789 Other specified health status: Secondary | ICD-10-CM | POA: Diagnosis not present

## 2020-03-31 DIAGNOSIS — Z8673 Personal history of transient ischemic attack (TIA), and cerebral infarction without residual deficits: Secondary | ICD-10-CM | POA: Diagnosis not present

## 2020-03-31 DIAGNOSIS — Z789 Other specified health status: Secondary | ICD-10-CM | POA: Diagnosis not present

## 2020-03-31 DIAGNOSIS — I10 Essential (primary) hypertension: Secondary | ICD-10-CM | POA: Diagnosis not present

## 2020-04-04 DIAGNOSIS — C642 Malignant neoplasm of left kidney, except renal pelvis: Secondary | ICD-10-CM | POA: Diagnosis not present

## 2020-04-04 DIAGNOSIS — J90 Pleural effusion, not elsewhere classified: Secondary | ICD-10-CM | POA: Diagnosis not present

## 2020-04-04 DIAGNOSIS — R601 Generalized edema: Secondary | ICD-10-CM | POA: Diagnosis not present

## 2020-04-04 DIAGNOSIS — R591 Generalized enlarged lymph nodes: Secondary | ICD-10-CM | POA: Diagnosis not present

## 2020-04-06 DIAGNOSIS — G459 Transient cerebral ischemic attack, unspecified: Secondary | ICD-10-CM | POA: Diagnosis not present

## 2020-04-06 DIAGNOSIS — R1084 Generalized abdominal pain: Secondary | ICD-10-CM | POA: Diagnosis not present

## 2020-04-06 DIAGNOSIS — I2510 Atherosclerotic heart disease of native coronary artery without angina pectoris: Secondary | ICD-10-CM | POA: Diagnosis not present

## 2020-04-06 DIAGNOSIS — J449 Chronic obstructive pulmonary disease, unspecified: Secondary | ICD-10-CM | POA: Diagnosis not present

## 2020-04-06 DIAGNOSIS — Z7901 Long term (current) use of anticoagulants: Secondary | ICD-10-CM | POA: Diagnosis not present

## 2020-04-06 DIAGNOSIS — K429 Umbilical hernia without obstruction or gangrene: Secondary | ICD-10-CM | POA: Diagnosis not present

## 2020-04-06 DIAGNOSIS — I10 Essential (primary) hypertension: Secondary | ICD-10-CM | POA: Diagnosis not present

## 2020-04-06 DIAGNOSIS — I82402 Acute embolism and thrombosis of unspecified deep veins of left lower extremity: Secondary | ICD-10-CM | POA: Diagnosis not present

## 2020-04-06 DIAGNOSIS — C642 Malignant neoplasm of left kidney, except renal pelvis: Secondary | ICD-10-CM | POA: Diagnosis not present

## 2020-04-06 DIAGNOSIS — I739 Peripheral vascular disease, unspecified: Secondary | ICD-10-CM | POA: Diagnosis not present

## 2020-04-06 DIAGNOSIS — Z789 Other specified health status: Secondary | ICD-10-CM | POA: Diagnosis not present

## 2020-04-06 DIAGNOSIS — Z8673 Personal history of transient ischemic attack (TIA), and cerebral infarction without residual deficits: Secondary | ICD-10-CM | POA: Diagnosis not present

## 2020-04-06 DIAGNOSIS — R590 Localized enlarged lymph nodes: Secondary | ICD-10-CM | POA: Diagnosis not present

## 2020-04-07 DIAGNOSIS — R601 Generalized edema: Secondary | ICD-10-CM | POA: Diagnosis not present

## 2020-04-10 DIAGNOSIS — T63461D Toxic effect of venom of wasps, accidental (unintentional), subsequent encounter: Secondary | ICD-10-CM | POA: Diagnosis not present

## 2020-04-10 DIAGNOSIS — T63451D Toxic effect of venom of hornets, accidental (unintentional), subsequent encounter: Secondary | ICD-10-CM | POA: Diagnosis not present

## 2020-04-20 DIAGNOSIS — R590 Localized enlarged lymph nodes: Secondary | ICD-10-CM | POA: Diagnosis not present

## 2020-04-20 DIAGNOSIS — C642 Malignant neoplasm of left kidney, except renal pelvis: Secondary | ICD-10-CM | POA: Diagnosis not present

## 2020-04-20 DIAGNOSIS — N1330 Unspecified hydronephrosis: Secondary | ICD-10-CM | POA: Diagnosis not present

## 2020-04-20 DIAGNOSIS — K429 Umbilical hernia without obstruction or gangrene: Secondary | ICD-10-CM | POA: Diagnosis not present

## 2020-04-20 DIAGNOSIS — N2889 Other specified disorders of kidney and ureter: Secondary | ICD-10-CM | POA: Diagnosis not present

## 2020-04-20 DIAGNOSIS — N3289 Other specified disorders of bladder: Secondary | ICD-10-CM | POA: Diagnosis not present

## 2020-04-20 DIAGNOSIS — G459 Transient cerebral ischemic attack, unspecified: Secondary | ICD-10-CM | POA: Diagnosis not present

## 2020-04-20 DIAGNOSIS — I2510 Atherosclerotic heart disease of native coronary artery without angina pectoris: Secondary | ICD-10-CM | POA: Diagnosis not present

## 2020-04-20 DIAGNOSIS — R1084 Generalized abdominal pain: Secondary | ICD-10-CM | POA: Diagnosis not present

## 2020-04-20 DIAGNOSIS — I639 Cerebral infarction, unspecified: Secondary | ICD-10-CM | POA: Diagnosis not present

## 2020-04-20 DIAGNOSIS — M79672 Pain in left foot: Secondary | ICD-10-CM | POA: Diagnosis not present

## 2020-04-20 DIAGNOSIS — I82402 Acute embolism and thrombosis of unspecified deep veins of left lower extremity: Secondary | ICD-10-CM | POA: Diagnosis not present

## 2020-04-20 DIAGNOSIS — Z789 Other specified health status: Secondary | ICD-10-CM | POA: Diagnosis not present

## 2020-04-20 DIAGNOSIS — I739 Peripheral vascular disease, unspecified: Secondary | ICD-10-CM | POA: Diagnosis not present

## 2020-04-20 DIAGNOSIS — J449 Chronic obstructive pulmonary disease, unspecified: Secondary | ICD-10-CM | POA: Diagnosis not present

## 2020-04-20 DIAGNOSIS — R41840 Attention and concentration deficit: Secondary | ICD-10-CM | POA: Diagnosis not present

## 2020-04-20 DIAGNOSIS — K4090 Unilateral inguinal hernia, without obstruction or gangrene, not specified as recurrent: Secondary | ICD-10-CM | POA: Diagnosis not present

## 2020-04-20 DIAGNOSIS — I10 Essential (primary) hypertension: Secondary | ICD-10-CM | POA: Diagnosis not present

## 2020-04-26 DIAGNOSIS — R339 Retention of urine, unspecified: Secondary | ICD-10-CM | POA: Diagnosis not present

## 2020-04-28 DIAGNOSIS — C642 Malignant neoplasm of left kidney, except renal pelvis: Secondary | ICD-10-CM | POA: Diagnosis not present

## 2020-04-28 DIAGNOSIS — Z8673 Personal history of transient ischemic attack (TIA), and cerebral infarction without residual deficits: Secondary | ICD-10-CM | POA: Diagnosis not present

## 2020-04-28 DIAGNOSIS — Z09 Encounter for follow-up examination after completed treatment for conditions other than malignant neoplasm: Secondary | ICD-10-CM | POA: Diagnosis not present

## 2020-04-28 DIAGNOSIS — I639 Cerebral infarction, unspecified: Secondary | ICD-10-CM | POA: Diagnosis not present

## 2020-05-04 DIAGNOSIS — K4090 Unilateral inguinal hernia, without obstruction or gangrene, not specified as recurrent: Secondary | ICD-10-CM | POA: Diagnosis not present

## 2020-05-04 DIAGNOSIS — R1084 Generalized abdominal pain: Secondary | ICD-10-CM | POA: Diagnosis not present

## 2020-05-04 DIAGNOSIS — N1330 Unspecified hydronephrosis: Secondary | ICD-10-CM | POA: Diagnosis not present

## 2020-05-04 DIAGNOSIS — K5730 Diverticulosis of large intestine without perforation or abscess without bleeding: Secondary | ICD-10-CM | POA: Diagnosis not present

## 2020-05-04 DIAGNOSIS — R41840 Attention and concentration deficit: Secondary | ICD-10-CM | POA: Diagnosis not present

## 2020-05-04 DIAGNOSIS — R748 Abnormal levels of other serum enzymes: Secondary | ICD-10-CM | POA: Diagnosis not present

## 2020-05-04 DIAGNOSIS — I824Z2 Acute embolism and thrombosis of unspecified deep veins of left distal lower extremity: Secondary | ICD-10-CM | POA: Diagnosis not present

## 2020-05-04 DIAGNOSIS — G459 Transient cerebral ischemic attack, unspecified: Secondary | ICD-10-CM | POA: Diagnosis not present

## 2020-05-04 DIAGNOSIS — I10 Essential (primary) hypertension: Secondary | ICD-10-CM | POA: Diagnosis not present

## 2020-05-04 DIAGNOSIS — I739 Peripheral vascular disease, unspecified: Secondary | ICD-10-CM | POA: Diagnosis not present

## 2020-05-04 DIAGNOSIS — I2510 Atherosclerotic heart disease of native coronary artery without angina pectoris: Secondary | ICD-10-CM | POA: Diagnosis not present

## 2020-05-04 DIAGNOSIS — K7689 Other specified diseases of liver: Secondary | ICD-10-CM | POA: Diagnosis not present

## 2020-05-04 DIAGNOSIS — D649 Anemia, unspecified: Secondary | ICD-10-CM | POA: Diagnosis not present

## 2020-05-04 DIAGNOSIS — K429 Umbilical hernia without obstruction or gangrene: Secondary | ICD-10-CM | POA: Diagnosis not present

## 2020-05-04 DIAGNOSIS — M79672 Pain in left foot: Secondary | ICD-10-CM | POA: Diagnosis not present

## 2020-05-04 DIAGNOSIS — R590 Localized enlarged lymph nodes: Secondary | ICD-10-CM | POA: Diagnosis not present

## 2020-05-04 DIAGNOSIS — C642 Malignant neoplasm of left kidney, except renal pelvis: Secondary | ICD-10-CM | POA: Diagnosis not present

## 2020-05-05 DIAGNOSIS — I639 Cerebral infarction, unspecified: Secondary | ICD-10-CM | POA: Diagnosis not present

## 2020-05-05 DIAGNOSIS — I6312 Cerebral infarction due to embolism of basilar artery: Secondary | ICD-10-CM | POA: Diagnosis not present

## 2020-05-11 DIAGNOSIS — R109 Unspecified abdominal pain: Secondary | ICD-10-CM | POA: Diagnosis not present

## 2020-05-11 DIAGNOSIS — R748 Abnormal levels of other serum enzymes: Secondary | ICD-10-CM | POA: Diagnosis not present

## 2020-05-11 DIAGNOSIS — C642 Malignant neoplasm of left kidney, except renal pelvis: Secondary | ICD-10-CM | POA: Diagnosis not present

## 2020-05-11 DIAGNOSIS — R590 Localized enlarged lymph nodes: Secondary | ICD-10-CM | POA: Diagnosis not present

## 2020-05-11 DIAGNOSIS — R1084 Generalized abdominal pain: Secondary | ICD-10-CM | POA: Diagnosis not present

## 2020-05-11 DIAGNOSIS — K828 Other specified diseases of gallbladder: Secondary | ICD-10-CM | POA: Diagnosis not present

## 2020-05-18 DIAGNOSIS — R41840 Attention and concentration deficit: Secondary | ICD-10-CM | POA: Diagnosis not present

## 2020-05-18 DIAGNOSIS — C642 Malignant neoplasm of left kidney, except renal pelvis: Secondary | ICD-10-CM | POA: Diagnosis not present

## 2020-05-18 DIAGNOSIS — N1330 Unspecified hydronephrosis: Secondary | ICD-10-CM | POA: Diagnosis not present

## 2020-05-18 DIAGNOSIS — M79672 Pain in left foot: Secondary | ICD-10-CM | POA: Diagnosis not present

## 2020-05-18 DIAGNOSIS — Z789 Other specified health status: Secondary | ICD-10-CM | POA: Diagnosis not present

## 2020-05-18 DIAGNOSIS — R160 Hepatomegaly, not elsewhere classified: Secondary | ICD-10-CM | POA: Diagnosis not present

## 2020-05-18 DIAGNOSIS — R748 Abnormal levels of other serum enzymes: Secondary | ICD-10-CM | POA: Diagnosis not present

## 2020-05-18 DIAGNOSIS — K429 Umbilical hernia without obstruction or gangrene: Secondary | ICD-10-CM | POA: Diagnosis not present

## 2020-05-18 DIAGNOSIS — I10 Essential (primary) hypertension: Secondary | ICD-10-CM | POA: Diagnosis not present

## 2020-05-18 DIAGNOSIS — R1084 Generalized abdominal pain: Secondary | ICD-10-CM | POA: Diagnosis not present

## 2020-05-18 DIAGNOSIS — K5730 Diverticulosis of large intestine without perforation or abscess without bleeding: Secondary | ICD-10-CM | POA: Diagnosis not present

## 2020-05-18 DIAGNOSIS — K7689 Other specified diseases of liver: Secondary | ICD-10-CM | POA: Diagnosis not present

## 2020-05-18 DIAGNOSIS — G459 Transient cerebral ischemic attack, unspecified: Secondary | ICD-10-CM | POA: Diagnosis not present

## 2020-05-18 DIAGNOSIS — D649 Anemia, unspecified: Secondary | ICD-10-CM | POA: Diagnosis not present

## 2020-05-18 DIAGNOSIS — K4090 Unilateral inguinal hernia, without obstruction or gangrene, not specified as recurrent: Secondary | ICD-10-CM | POA: Diagnosis not present

## 2020-05-18 DIAGNOSIS — R109 Unspecified abdominal pain: Secondary | ICD-10-CM | POA: Diagnosis not present

## 2020-05-18 DIAGNOSIS — I824Z2 Acute embolism and thrombosis of unspecified deep veins of left distal lower extremity: Secondary | ICD-10-CM | POA: Diagnosis not present

## 2020-05-18 DIAGNOSIS — R590 Localized enlarged lymph nodes: Secondary | ICD-10-CM | POA: Diagnosis not present

## 2020-05-19 DIAGNOSIS — I10 Essential (primary) hypertension: Secondary | ICD-10-CM | POA: Diagnosis not present

## 2020-05-19 DIAGNOSIS — R109 Unspecified abdominal pain: Secondary | ICD-10-CM | POA: Diagnosis not present

## 2020-05-19 DIAGNOSIS — C642 Malignant neoplasm of left kidney, except renal pelvis: Secondary | ICD-10-CM | POA: Diagnosis not present

## 2020-05-19 DIAGNOSIS — K5790 Diverticulosis of intestine, part unspecified, without perforation or abscess without bleeding: Secondary | ICD-10-CM | POA: Diagnosis not present

## 2020-05-19 DIAGNOSIS — Z87891 Personal history of nicotine dependence: Secondary | ICD-10-CM | POA: Diagnosis not present

## 2020-05-19 DIAGNOSIS — C7910 Secondary malignant neoplasm of unspecified urinary organs: Secondary | ICD-10-CM | POA: Diagnosis not present

## 2020-05-19 DIAGNOSIS — J449 Chronic obstructive pulmonary disease, unspecified: Secondary | ICD-10-CM | POA: Diagnosis not present

## 2020-05-19 DIAGNOSIS — Z95828 Presence of other vascular implants and grafts: Secondary | ICD-10-CM | POA: Diagnosis not present

## 2020-05-22 DIAGNOSIS — T63461D Toxic effect of venom of wasps, accidental (unintentional), subsequent encounter: Secondary | ICD-10-CM | POA: Diagnosis not present

## 2020-05-22 DIAGNOSIS — T63451D Toxic effect of venom of hornets, accidental (unintentional), subsequent encounter: Secondary | ICD-10-CM | POA: Diagnosis not present

## 2020-05-23 DIAGNOSIS — R1084 Generalized abdominal pain: Secondary | ICD-10-CM | POA: Diagnosis not present

## 2020-05-23 DIAGNOSIS — M79672 Pain in left foot: Secondary | ICD-10-CM | POA: Diagnosis not present

## 2020-05-23 DIAGNOSIS — R590 Localized enlarged lymph nodes: Secondary | ICD-10-CM | POA: Diagnosis not present

## 2020-05-23 DIAGNOSIS — K5730 Diverticulosis of large intestine without perforation or abscess without bleeding: Secondary | ICD-10-CM | POA: Diagnosis not present

## 2020-05-23 DIAGNOSIS — R41840 Attention and concentration deficit: Secondary | ICD-10-CM | POA: Diagnosis not present

## 2020-05-23 DIAGNOSIS — G459 Transient cerebral ischemic attack, unspecified: Secondary | ICD-10-CM | POA: Diagnosis not present

## 2020-05-23 DIAGNOSIS — C642 Malignant neoplasm of left kidney, except renal pelvis: Secondary | ICD-10-CM | POA: Diagnosis not present

## 2020-05-23 DIAGNOSIS — D649 Anemia, unspecified: Secondary | ICD-10-CM | POA: Diagnosis not present

## 2020-05-23 DIAGNOSIS — I10 Essential (primary) hypertension: Secondary | ICD-10-CM | POA: Diagnosis not present

## 2020-05-23 DIAGNOSIS — K429 Umbilical hernia without obstruction or gangrene: Secondary | ICD-10-CM | POA: Diagnosis not present

## 2020-05-23 DIAGNOSIS — R748 Abnormal levels of other serum enzymes: Secondary | ICD-10-CM | POA: Diagnosis not present

## 2020-05-23 DIAGNOSIS — I824Z2 Acute embolism and thrombosis of unspecified deep veins of left distal lower extremity: Secondary | ICD-10-CM | POA: Diagnosis not present

## 2020-05-23 DIAGNOSIS — N1330 Unspecified hydronephrosis: Secondary | ICD-10-CM | POA: Diagnosis not present

## 2020-05-23 DIAGNOSIS — K4090 Unilateral inguinal hernia, without obstruction or gangrene, not specified as recurrent: Secondary | ICD-10-CM | POA: Diagnosis not present

## 2020-05-23 DIAGNOSIS — K7689 Other specified diseases of liver: Secondary | ICD-10-CM | POA: Diagnosis not present

## 2020-05-25 DIAGNOSIS — R339 Retention of urine, unspecified: Secondary | ICD-10-CM | POA: Diagnosis not present

## 2020-05-26 DIAGNOSIS — Z87891 Personal history of nicotine dependence: Secondary | ICD-10-CM | POA: Diagnosis not present

## 2020-05-26 DIAGNOSIS — I10 Essential (primary) hypertension: Secondary | ICD-10-CM | POA: Diagnosis not present

## 2020-05-26 DIAGNOSIS — J449 Chronic obstructive pulmonary disease, unspecified: Secondary | ICD-10-CM | POA: Diagnosis not present

## 2020-05-26 DIAGNOSIS — Z960 Presence of urogenital implants: Secondary | ICD-10-CM | POA: Diagnosis not present

## 2020-05-26 DIAGNOSIS — K5730 Diverticulosis of large intestine without perforation or abscess without bleeding: Secondary | ICD-10-CM | POA: Diagnosis not present

## 2020-05-26 DIAGNOSIS — R590 Localized enlarged lymph nodes: Secondary | ICD-10-CM | POA: Diagnosis not present

## 2020-05-30 DIAGNOSIS — K828 Other specified diseases of gallbladder: Secondary | ICD-10-CM | POA: Diagnosis not present

## 2020-05-30 DIAGNOSIS — J439 Emphysema, unspecified: Secondary | ICD-10-CM | POA: Diagnosis not present

## 2020-05-30 DIAGNOSIS — N3289 Other specified disorders of bladder: Secondary | ICD-10-CM | POA: Diagnosis not present

## 2020-05-30 DIAGNOSIS — J9811 Atelectasis: Secondary | ICD-10-CM | POA: Diagnosis not present

## 2020-05-30 DIAGNOSIS — C7910 Secondary malignant neoplasm of unspecified urinary organs: Secondary | ICD-10-CM | POA: Diagnosis not present

## 2020-05-30 DIAGNOSIS — J90 Pleural effusion, not elsewhere classified: Secondary | ICD-10-CM | POA: Diagnosis not present

## 2020-05-30 DIAGNOSIS — C689 Malignant neoplasm of urinary organ, unspecified: Secondary | ICD-10-CM | POA: Diagnosis not present

## 2020-05-30 DIAGNOSIS — R188 Other ascites: Secondary | ICD-10-CM | POA: Diagnosis not present

## 2020-05-30 DIAGNOSIS — K8689 Other specified diseases of pancreas: Secondary | ICD-10-CM | POA: Diagnosis not present

## 2020-05-30 DIAGNOSIS — N1330 Unspecified hydronephrosis: Secondary | ICD-10-CM | POA: Diagnosis not present

## 2020-05-30 DIAGNOSIS — K820 Obstruction of gallbladder: Secondary | ICD-10-CM | POA: Diagnosis not present

## 2020-06-05 DIAGNOSIS — D4959 Neoplasm of unspecified behavior of other genitourinary organ: Secondary | ICD-10-CM | POA: Diagnosis not present

## 2020-06-05 DIAGNOSIS — Z9889 Other specified postprocedural states: Secondary | ICD-10-CM | POA: Diagnosis not present

## 2020-06-05 DIAGNOSIS — R591 Generalized enlarged lymph nodes: Secondary | ICD-10-CM | POA: Diagnosis not present

## 2020-06-05 DIAGNOSIS — Z905 Acquired absence of kidney: Secondary | ICD-10-CM | POA: Diagnosis not present

## 2020-06-05 DIAGNOSIS — Z8673 Personal history of transient ischemic attack (TIA), and cerebral infarction without residual deficits: Secondary | ICD-10-CM | POA: Diagnosis not present

## 2020-06-05 DIAGNOSIS — Z9221 Personal history of antineoplastic chemotherapy: Secondary | ICD-10-CM | POA: Diagnosis not present

## 2020-06-05 DIAGNOSIS — C642 Malignant neoplasm of left kidney, except renal pelvis: Secondary | ICD-10-CM | POA: Diagnosis not present

## 2020-06-05 DIAGNOSIS — J439 Emphysema, unspecified: Secondary | ICD-10-CM | POA: Diagnosis not present

## 2020-06-05 DIAGNOSIS — R590 Localized enlarged lymph nodes: Secondary | ICD-10-CM | POA: Diagnosis not present

## 2020-06-05 DIAGNOSIS — Z86010 Personal history of colonic polyps: Secondary | ICD-10-CM | POA: Diagnosis not present

## 2020-06-05 DIAGNOSIS — Z85520 Personal history of malignant carcinoid tumor of kidney: Secondary | ICD-10-CM | POA: Diagnosis not present

## 2020-06-05 DIAGNOSIS — N1330 Unspecified hydronephrosis: Secondary | ICD-10-CM | POA: Diagnosis not present

## 2020-06-05 DIAGNOSIS — M1990 Unspecified osteoarthritis, unspecified site: Secondary | ICD-10-CM | POA: Diagnosis not present

## 2020-06-05 DIAGNOSIS — R600 Localized edema: Secondary | ICD-10-CM | POA: Diagnosis not present

## 2020-06-05 DIAGNOSIS — N400 Enlarged prostate without lower urinary tract symptoms: Secondary | ICD-10-CM | POA: Diagnosis not present

## 2020-06-05 DIAGNOSIS — Z7982 Long term (current) use of aspirin: Secondary | ICD-10-CM | POA: Diagnosis not present

## 2020-06-05 DIAGNOSIS — N289 Disorder of kidney and ureter, unspecified: Secondary | ICD-10-CM | POA: Diagnosis not present

## 2020-06-05 DIAGNOSIS — I2510 Atherosclerotic heart disease of native coronary artery without angina pectoris: Secondary | ICD-10-CM | POA: Diagnosis not present

## 2020-06-05 DIAGNOSIS — I10 Essential (primary) hypertension: Secondary | ICD-10-CM | POA: Diagnosis not present

## 2020-06-05 DIAGNOSIS — C689 Malignant neoplasm of urinary organ, unspecified: Secondary | ICD-10-CM | POA: Diagnosis not present

## 2020-06-08 DIAGNOSIS — R82998 Other abnormal findings in urine: Secondary | ICD-10-CM | POA: Diagnosis not present

## 2020-06-08 DIAGNOSIS — B372 Candidiasis of skin and nail: Secondary | ICD-10-CM | POA: Diagnosis not present

## 2020-06-08 DIAGNOSIS — Z789 Other specified health status: Secondary | ICD-10-CM | POA: Diagnosis not present

## 2020-06-08 DIAGNOSIS — J449 Chronic obstructive pulmonary disease, unspecified: Secondary | ICD-10-CM | POA: Diagnosis not present

## 2020-06-08 DIAGNOSIS — N3289 Other specified disorders of bladder: Secondary | ICD-10-CM | POA: Diagnosis not present

## 2020-06-08 DIAGNOSIS — G459 Transient cerebral ischemic attack, unspecified: Secondary | ICD-10-CM | POA: Diagnosis not present

## 2020-06-08 DIAGNOSIS — D4959 Neoplasm of unspecified behavior of other genitourinary organ: Secondary | ICD-10-CM | POA: Diagnosis not present

## 2020-06-08 DIAGNOSIS — R7989 Other specified abnormal findings of blood chemistry: Secondary | ICD-10-CM | POA: Diagnosis not present

## 2020-06-08 DIAGNOSIS — I739 Peripheral vascular disease, unspecified: Secondary | ICD-10-CM | POA: Diagnosis not present

## 2020-06-08 DIAGNOSIS — F419 Anxiety disorder, unspecified: Secondary | ICD-10-CM | POA: Diagnosis not present

## 2020-06-08 DIAGNOSIS — I10 Essential (primary) hypertension: Secondary | ICD-10-CM | POA: Diagnosis not present

## 2020-06-08 DIAGNOSIS — R112 Nausea with vomiting, unspecified: Secondary | ICD-10-CM | POA: Diagnosis not present

## 2020-06-08 DIAGNOSIS — M79672 Pain in left foot: Secondary | ICD-10-CM | POA: Diagnosis not present

## 2020-06-08 DIAGNOSIS — Z8673 Personal history of transient ischemic attack (TIA), and cerebral infarction without residual deficits: Secondary | ICD-10-CM | POA: Diagnosis not present

## 2020-06-08 DIAGNOSIS — C642 Malignant neoplasm of left kidney, except renal pelvis: Secondary | ICD-10-CM | POA: Diagnosis not present

## 2020-06-08 DIAGNOSIS — Z905 Acquired absence of kidney: Secondary | ICD-10-CM | POA: Diagnosis not present

## 2020-06-08 DIAGNOSIS — R591 Generalized enlarged lymph nodes: Secondary | ICD-10-CM | POA: Diagnosis not present

## 2020-06-08 DIAGNOSIS — I824Z2 Acute embolism and thrombosis of unspecified deep veins of left distal lower extremity: Secondary | ICD-10-CM | POA: Diagnosis not present

## 2020-06-08 DIAGNOSIS — Z5112 Encounter for antineoplastic immunotherapy: Secondary | ICD-10-CM | POA: Diagnosis not present

## 2020-06-08 DIAGNOSIS — I639 Cerebral infarction, unspecified: Secondary | ICD-10-CM | POA: Diagnosis not present

## 2020-06-09 DIAGNOSIS — C642 Malignant neoplasm of left kidney, except renal pelvis: Secondary | ICD-10-CM | POA: Diagnosis not present

## 2020-06-09 DIAGNOSIS — R112 Nausea with vomiting, unspecified: Secondary | ICD-10-CM | POA: Diagnosis not present

## 2020-06-09 DIAGNOSIS — F419 Anxiety disorder, unspecified: Secondary | ICD-10-CM | POA: Diagnosis not present

## 2020-06-09 DIAGNOSIS — Z5112 Encounter for antineoplastic immunotherapy: Secondary | ICD-10-CM | POA: Diagnosis not present

## 2020-06-09 DIAGNOSIS — J449 Chronic obstructive pulmonary disease, unspecified: Secondary | ICD-10-CM | POA: Diagnosis not present

## 2020-06-09 DIAGNOSIS — I739 Peripheral vascular disease, unspecified: Secondary | ICD-10-CM | POA: Diagnosis not present

## 2020-06-09 DIAGNOSIS — M79672 Pain in left foot: Secondary | ICD-10-CM | POA: Diagnosis not present

## 2020-06-09 DIAGNOSIS — B372 Candidiasis of skin and nail: Secondary | ICD-10-CM | POA: Diagnosis not present

## 2020-06-09 DIAGNOSIS — I10 Essential (primary) hypertension: Secondary | ICD-10-CM | POA: Diagnosis not present

## 2020-06-09 DIAGNOSIS — R7989 Other specified abnormal findings of blood chemistry: Secondary | ICD-10-CM | POA: Diagnosis not present

## 2020-06-09 DIAGNOSIS — R82998 Other abnormal findings in urine: Secondary | ICD-10-CM | POA: Diagnosis not present

## 2020-06-09 DIAGNOSIS — Z8673 Personal history of transient ischemic attack (TIA), and cerebral infarction without residual deficits: Secondary | ICD-10-CM | POA: Diagnosis not present

## 2020-06-09 DIAGNOSIS — N3289 Other specified disorders of bladder: Secondary | ICD-10-CM | POA: Diagnosis not present

## 2020-06-09 DIAGNOSIS — I824Z2 Acute embolism and thrombosis of unspecified deep veins of left distal lower extremity: Secondary | ICD-10-CM | POA: Diagnosis not present

## 2020-06-09 DIAGNOSIS — Z905 Acquired absence of kidney: Secondary | ICD-10-CM | POA: Diagnosis not present

## 2020-06-09 DIAGNOSIS — R591 Generalized enlarged lymph nodes: Secondary | ICD-10-CM | POA: Diagnosis not present

## 2020-06-21 DIAGNOSIS — F419 Anxiety disorder, unspecified: Secondary | ICD-10-CM | POA: Diagnosis not present

## 2020-06-21 DIAGNOSIS — J449 Chronic obstructive pulmonary disease, unspecified: Secondary | ICD-10-CM | POA: Diagnosis not present

## 2020-06-21 DIAGNOSIS — Z905 Acquired absence of kidney: Secondary | ICD-10-CM | POA: Diagnosis not present

## 2020-06-21 DIAGNOSIS — B3789 Other sites of candidiasis: Secondary | ICD-10-CM | POA: Diagnosis not present

## 2020-06-21 DIAGNOSIS — Z5112 Encounter for antineoplastic immunotherapy: Secondary | ICD-10-CM | POA: Diagnosis not present

## 2020-06-21 DIAGNOSIS — I739 Peripheral vascular disease, unspecified: Secondary | ICD-10-CM | POA: Diagnosis not present

## 2020-06-21 DIAGNOSIS — M79672 Pain in left foot: Secondary | ICD-10-CM | POA: Diagnosis not present

## 2020-06-21 DIAGNOSIS — N3289 Other specified disorders of bladder: Secondary | ICD-10-CM | POA: Diagnosis not present

## 2020-06-21 DIAGNOSIS — I824Z2 Acute embolism and thrombosis of unspecified deep veins of left distal lower extremity: Secondary | ICD-10-CM | POA: Diagnosis not present

## 2020-06-21 DIAGNOSIS — I10 Essential (primary) hypertension: Secondary | ICD-10-CM | POA: Diagnosis not present

## 2020-06-21 DIAGNOSIS — Z8673 Personal history of transient ischemic attack (TIA), and cerebral infarction without residual deficits: Secondary | ICD-10-CM | POA: Diagnosis not present

## 2020-06-21 DIAGNOSIS — B372 Candidiasis of skin and nail: Secondary | ICD-10-CM | POA: Diagnosis not present

## 2020-06-21 DIAGNOSIS — R591 Generalized enlarged lymph nodes: Secondary | ICD-10-CM | POA: Diagnosis not present

## 2020-06-21 DIAGNOSIS — R7989 Other specified abnormal findings of blood chemistry: Secondary | ICD-10-CM | POA: Diagnosis not present

## 2020-06-21 DIAGNOSIS — R82998 Other abnormal findings in urine: Secondary | ICD-10-CM | POA: Diagnosis not present

## 2020-06-21 DIAGNOSIS — F418 Other specified anxiety disorders: Secondary | ICD-10-CM | POA: Diagnosis not present

## 2020-06-21 DIAGNOSIS — C642 Malignant neoplasm of left kidney, except renal pelvis: Secondary | ICD-10-CM | POA: Diagnosis not present

## 2020-06-21 DIAGNOSIS — R112 Nausea with vomiting, unspecified: Secondary | ICD-10-CM | POA: Diagnosis not present

## 2020-06-23 DIAGNOSIS — C651 Malignant neoplasm of right renal pelvis: Secondary | ICD-10-CM | POA: Diagnosis not present

## 2020-07-01 DIAGNOSIS — I2510 Atherosclerotic heart disease of native coronary artery without angina pectoris: Secondary | ICD-10-CM | POA: Diagnosis not present

## 2020-07-01 DIAGNOSIS — Z87891 Personal history of nicotine dependence: Secondary | ICD-10-CM | POA: Diagnosis not present

## 2020-07-01 DIAGNOSIS — J439 Emphysema, unspecified: Secondary | ICD-10-CM | POA: Diagnosis not present

## 2020-07-01 DIAGNOSIS — Z79899 Other long term (current) drug therapy: Secondary | ICD-10-CM | POA: Diagnosis not present

## 2020-07-01 DIAGNOSIS — Z7902 Long term (current) use of antithrombotics/antiplatelets: Secondary | ICD-10-CM | POA: Diagnosis not present

## 2020-07-01 DIAGNOSIS — M7989 Other specified soft tissue disorders: Secondary | ICD-10-CM | POA: Diagnosis not present

## 2020-07-01 DIAGNOSIS — R2242 Localized swelling, mass and lump, left lower limb: Secondary | ICD-10-CM | POA: Diagnosis not present

## 2020-07-01 DIAGNOSIS — M79605 Pain in left leg: Secondary | ICD-10-CM | POA: Diagnosis not present

## 2020-07-01 DIAGNOSIS — E871 Hypo-osmolality and hyponatremia: Secondary | ICD-10-CM | POA: Diagnosis not present

## 2020-07-01 DIAGNOSIS — Z7982 Long term (current) use of aspirin: Secondary | ICD-10-CM | POA: Diagnosis not present

## 2020-07-01 DIAGNOSIS — Z87440 Personal history of urinary (tract) infections: Secondary | ICD-10-CM | POA: Diagnosis not present

## 2020-07-01 DIAGNOSIS — I10 Essential (primary) hypertension: Secondary | ICD-10-CM | POA: Diagnosis not present

## 2020-07-01 DIAGNOSIS — Z91030 Bee allergy status: Secondary | ICD-10-CM | POA: Diagnosis not present

## 2020-07-05 DIAGNOSIS — I2510 Atherosclerotic heart disease of native coronary artery without angina pectoris: Secondary | ICD-10-CM | POA: Diagnosis not present

## 2020-07-05 DIAGNOSIS — C642 Malignant neoplasm of left kidney, except renal pelvis: Secondary | ICD-10-CM | POA: Diagnosis not present

## 2020-07-05 DIAGNOSIS — R188 Other ascites: Secondary | ICD-10-CM | POA: Diagnosis not present

## 2020-07-05 DIAGNOSIS — K4091 Unilateral inguinal hernia, without obstruction or gangrene, recurrent: Secondary | ICD-10-CM | POA: Diagnosis not present

## 2020-07-05 DIAGNOSIS — J90 Pleural effusion, not elsewhere classified: Secondary | ICD-10-CM | POA: Diagnosis not present

## 2020-07-28 DEATH — deceased

## 2020-11-23 IMAGING — CT CT ABD-PELV W/ CM
1 of 3 series · 12 of 32 positions shown, 17 images · IV contrast (APPLIED)
Comparison: None.

CLINICAL DATA: Dull ache across the lower abdomen with palpable
fullness. Symptoms intermittent for 6 weeks. Previous appendectomy.

EXAM:
CT ABDOMEN AND PELVIS WITH CONTRAST
TECHNIQUE: Multidetector CT imaging of the abdomen and pelvis was performed
using the standard protocol following bolus administration of
intravenous contrast.
CONTRAST:  100mL [id number] IOPAMIDOL ([id number]) INJECTION 61%

[Series 2: abd/pelvis w/cm · axial · 0.74mm/px · z∈[-476,-96]mm · 12 of 88 slices shown, 17 images]
[im 6/88  soft-tissue]
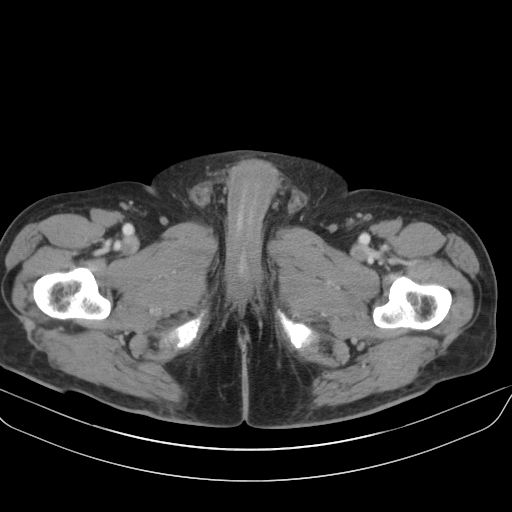
[im 6/88  bone]
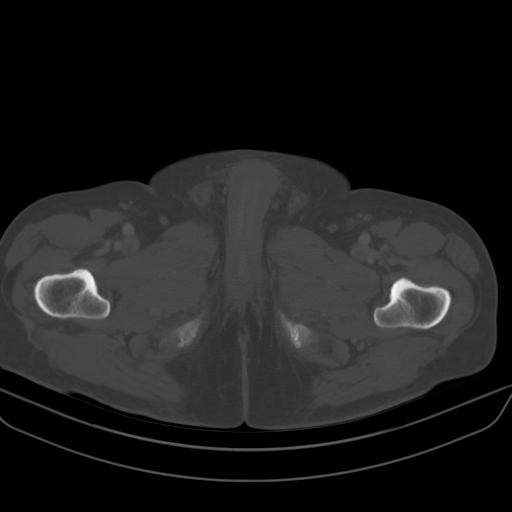
[im 17/88  soft-tissue]
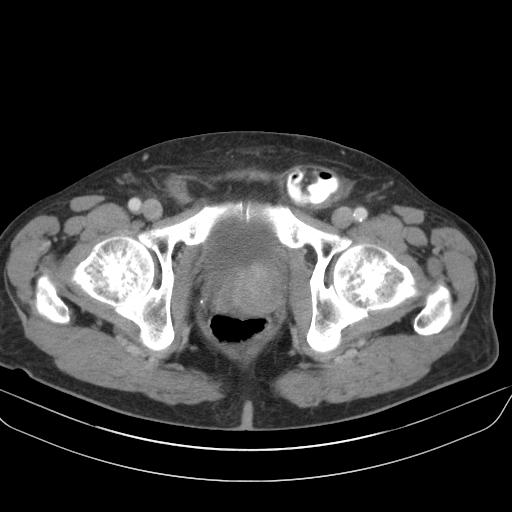
[im 22/88  soft-tissue]
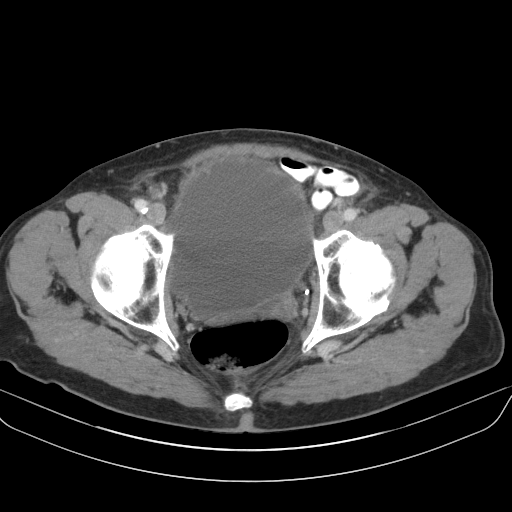
[im 28/88  soft-tissue]
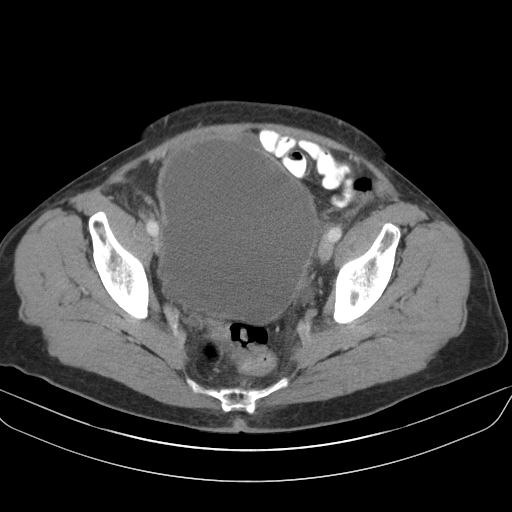
[im 39/88  soft-tissue]
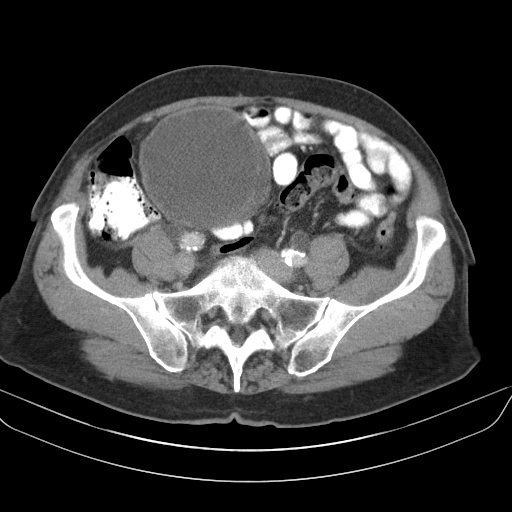
[im 44/88  soft-tissue]
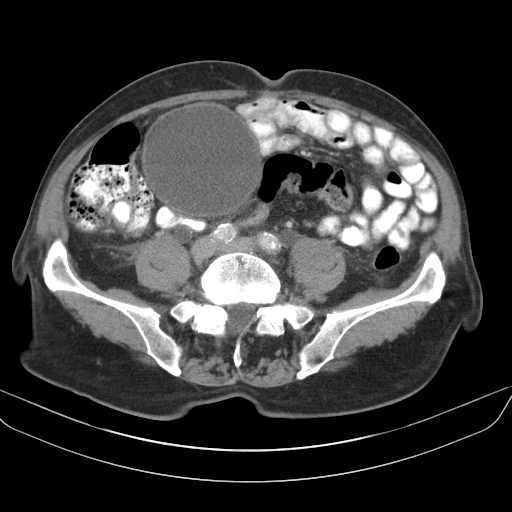
[im 49/88  soft-tissue]
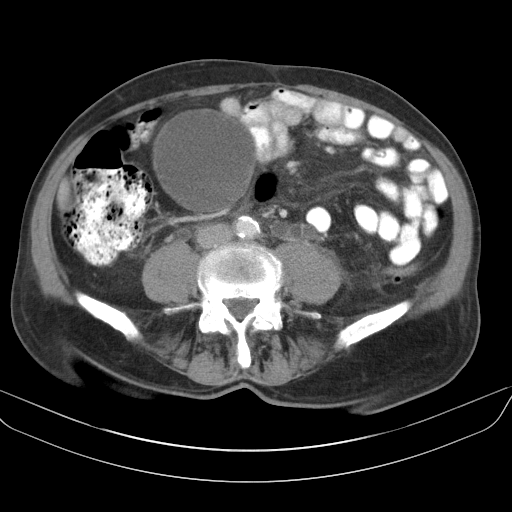
[im 60/88  soft-tissue]
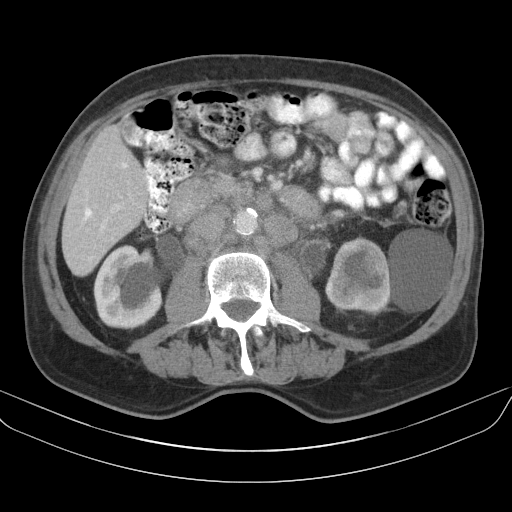
[im 66/88  soft-tissue]
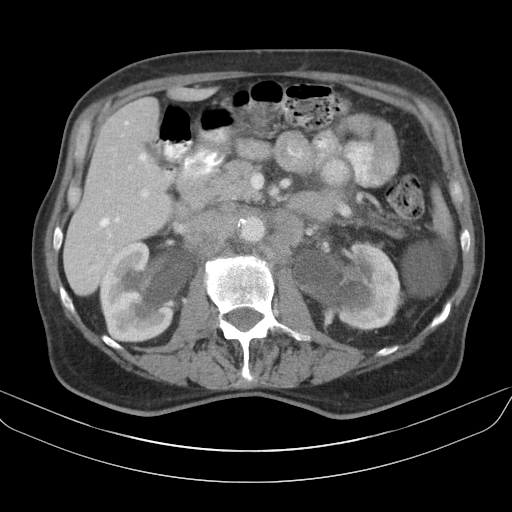
[im 66/88  lung]
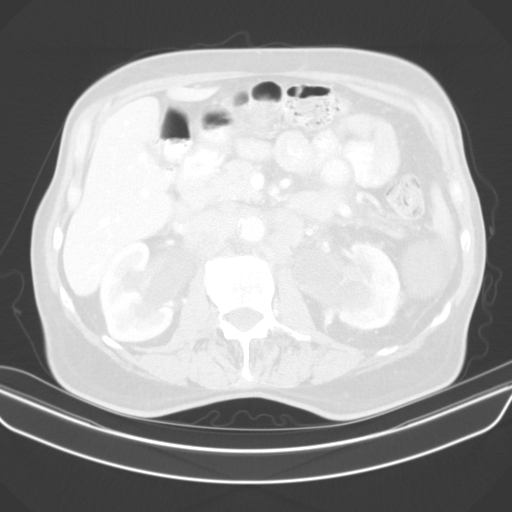
[im 66/88  bone]
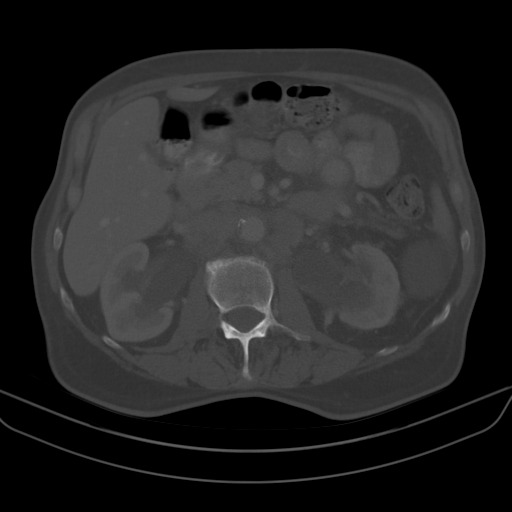
[im 71/88  soft-tissue]
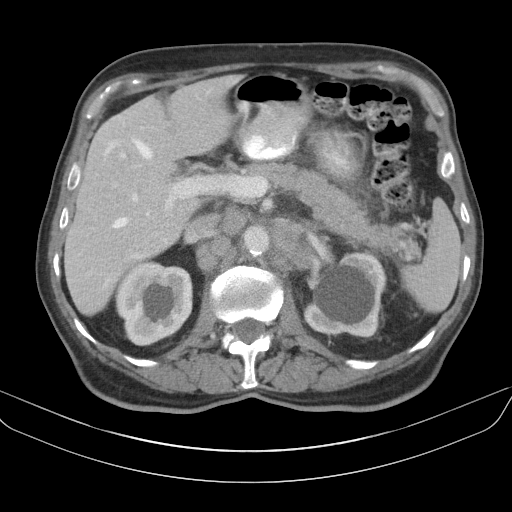
[im 71/88  lung]
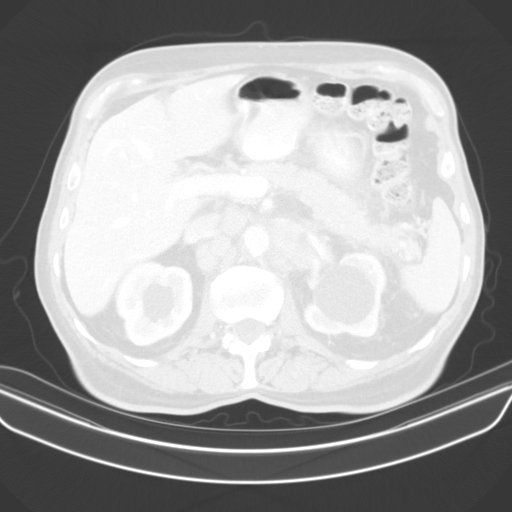
[im 77/88  lung]
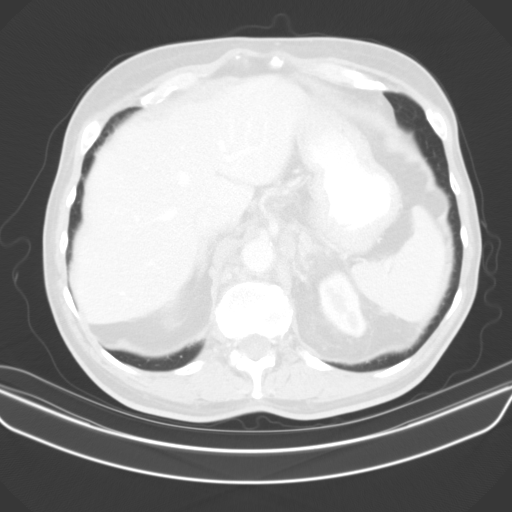
[im 82/88  soft-tissue]
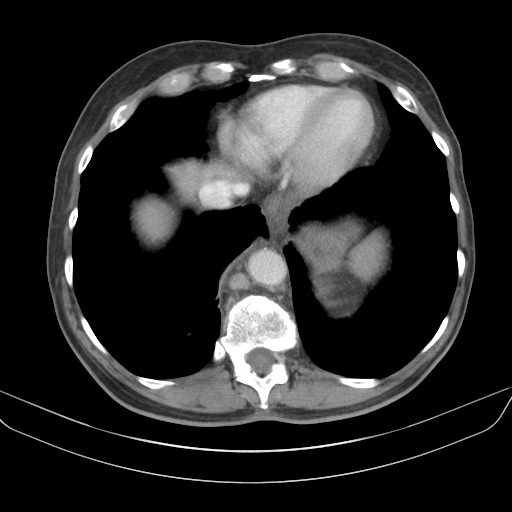
[im 82/88  lung]
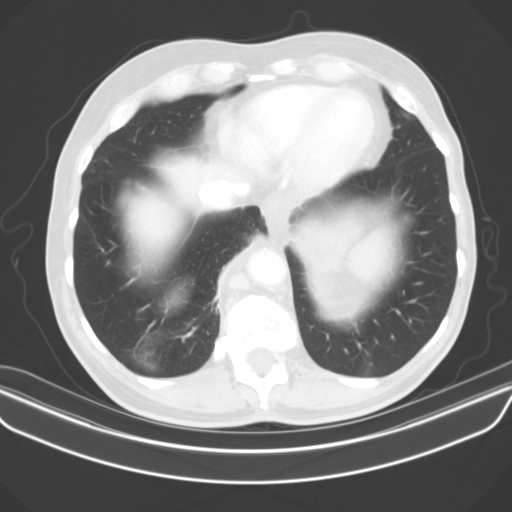

[12 of 32 positions shown; findings below may reference images not displayed]

FINDINGS: Lower chest: No acute abnormality.

Hepatobiliary: 5 mm low-density lesion, peripheral aspect of the
liver, medial segment, left lobe, consistent with a cyst. No other
liver abnormalities. Gallbladder mostly decompressed but otherwise
unremarkable. No bile duct dilation

Pancreas: Unremarkable. No pancreatic ductal dilatation or
surrounding inflammatory changes.

Spleen: Normal in size without focal abnormality.

Adrenals/Urinary Tract: No adrenal masses.

Severe bilateral hydronephrosis. This is greater on the left where
there is also mild cortical thinning. Large exophytic lower pole
left renal cyst measuring 6.2 cm. Left kidney lower pole
low-attenuation mass measuring 1.3 cm, not clearly a cyst. 3-4 mm
low-attenuation mass, anterior midpole the right kidney too small to
fully characterize but likely a cyst. No intrarenal stones.

Moderate to severe dilation of the ureters all way to the bladder
ureteral stone or evidence of obstruction.

Bladder is distended. Wall appears mildly thickened small apparent
diverticula. No bladder mass or stone.

Stomach/Bowel: Stomach is unremarkable.

Small bowel is normal in caliber. A loop of small bowel enters a
left inguinal hernia without evidence of obstruction, incarceration
or strangulation. No small bowel wall thickening or adjacent
inflammation.

Colon is normal in caliber. There scattered diverticula mostly along
the left colon. No evidence of diverticulitis or other colonic
inflammatory process.

Vascular/Lymphatic: There is bulky retroperitoneal adenopathy. There
are prominent retrocrural lymph nodes at the aortic hiatus. Enlarged
nodes seen as superior as the superior mesenteric artery. Several
reference measurements remain. Right periaortic node just below the
right renal artery, 1.6 cm in short axis. Left periaortic node at a
similar axial level, 2 cm in short axis. Left periaortic node at the
level of the lower pole the left kidney, 2 cm in short axis. No
enlarged pelvic lymph nodes.

Significant aortic atherosclerosis. Infrarenal aorta is dilated to
2.4 cm. Atherosclerotic disease extends into the iliac arteries.

Reproductive: Prostate is enlarged measuring 5.7 cm x 4.9 x 5.2 cm.
Prostate bulges into the bladder base.

Other: Small fat containing umbilical hernia. No other hernias. No
ascites.

Musculoskeletal: No fracture or acute finding. No osteoblastic or
suspicious bone lesions.
IMPRESSION: 1. Marked bilateral hydronephrosis as well as bilateral hydroureter
with a distended bladder and enlarged prostate that bulges into
bladder base. Prostate enlargement is suspected as the cause of
bladder outlet obstruction and secondary hydroureteronephrosis.
2. There is also bulky retroperitoneal adenopathy. The constellation
of findings is suspicious for prostate carcinoma. Adenopathy
alternatively could be due to lymphoma or other lymphoproliferative
disorder.
3. Left inguinal hernia containing a loop of small bowel, but
without evidence of obstruction, incarceration or strangulation.
4. Chronic findings include aortic atherosclerosis and colonic
diverticulosis.

## 2020-12-18 IMAGING — US US RENAL
1 series · 13 of 25 positions shown · non-contrast
Comparison: 08/27/2019

CLINICAL DATA: Hydronephrosis

EXAM:
RENAL / URINARY TRACT ULTRASOUND COMPLETE

[Series 1: us renal · 0.23mm/px · 13 of 78 slices shown]
[im 1/78]
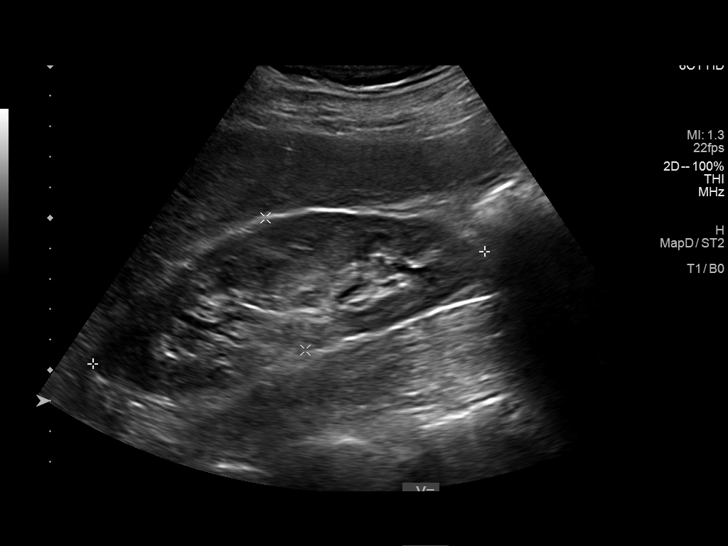
[im 7/78]
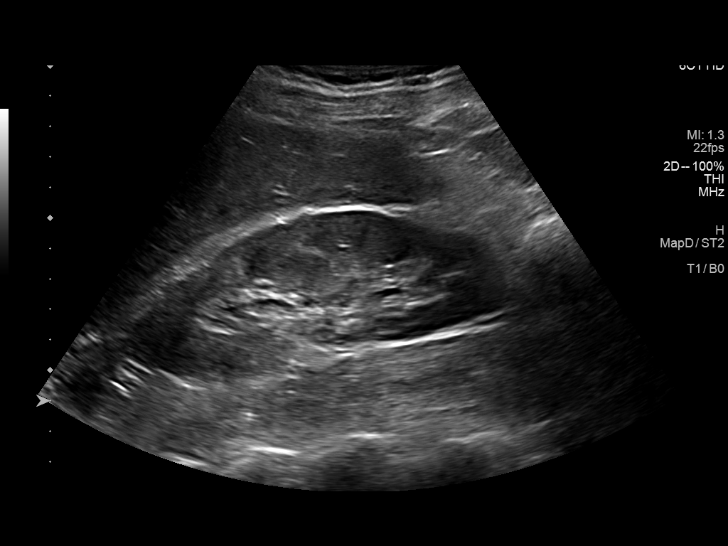
[im 13/78]
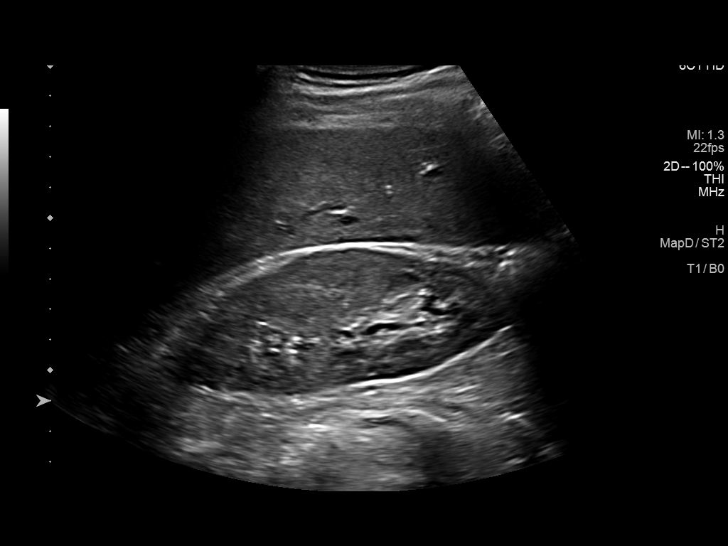
[im 20/78]
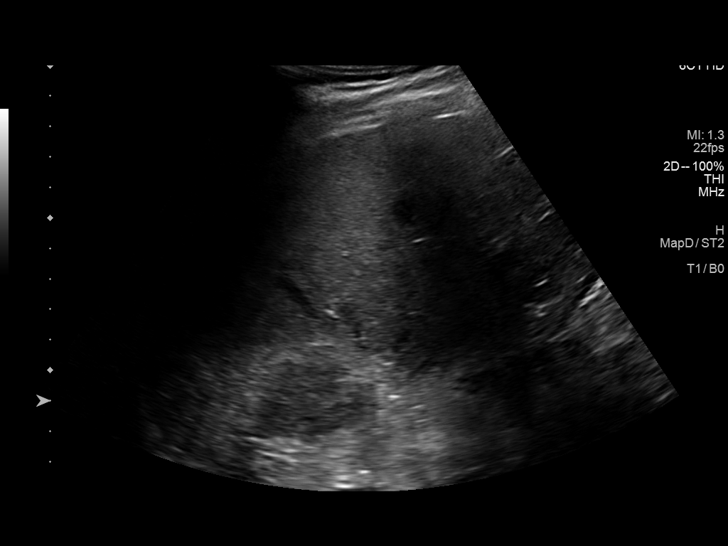
[im 26/78]
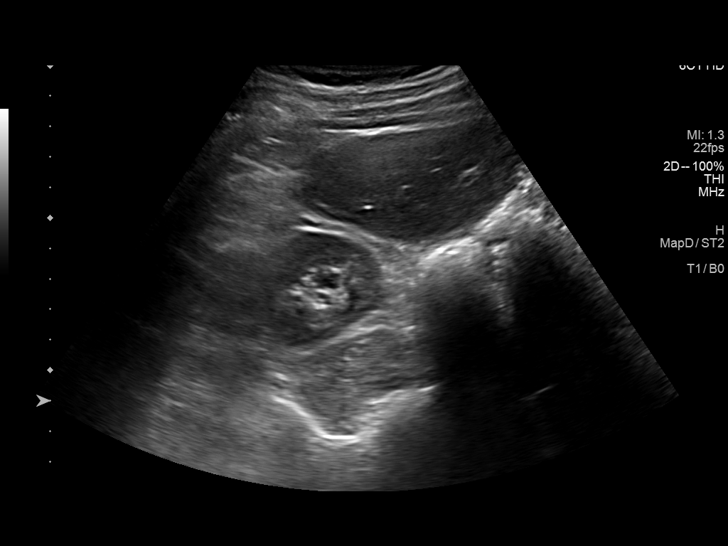
[im 33/78]
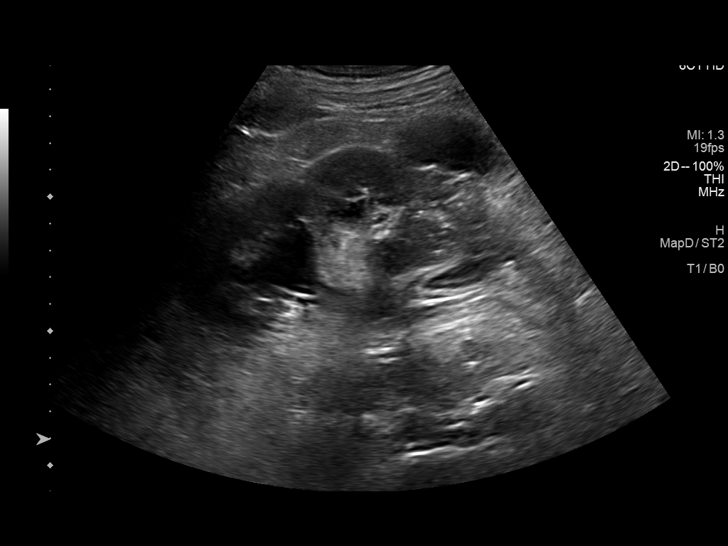
[im 39/78]
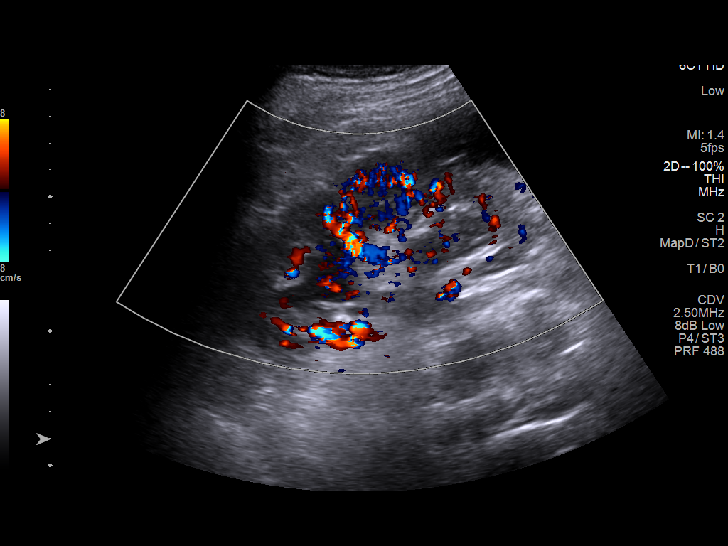
[im 45/78]
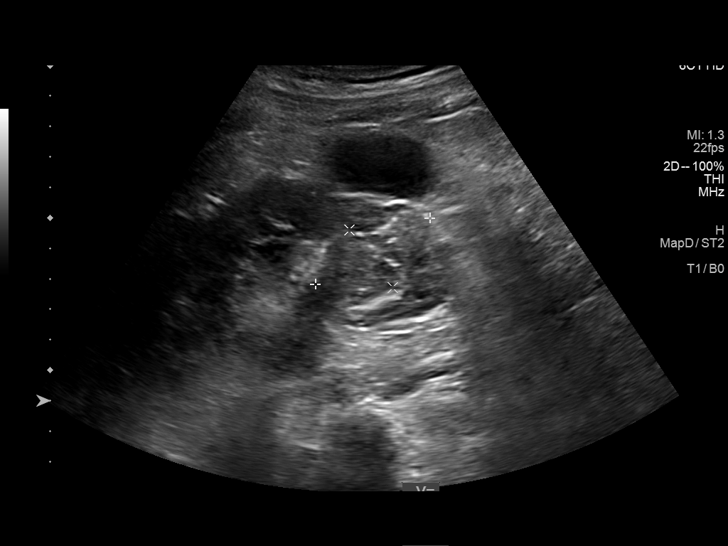
[im 52/78]
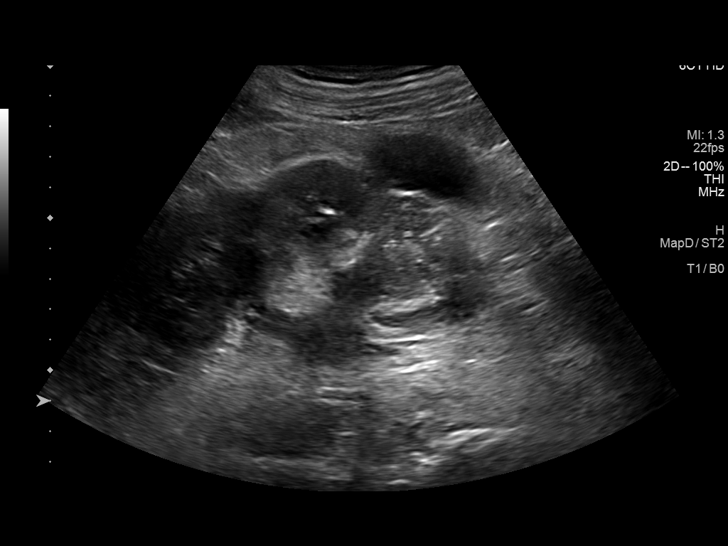
[im 58/78]
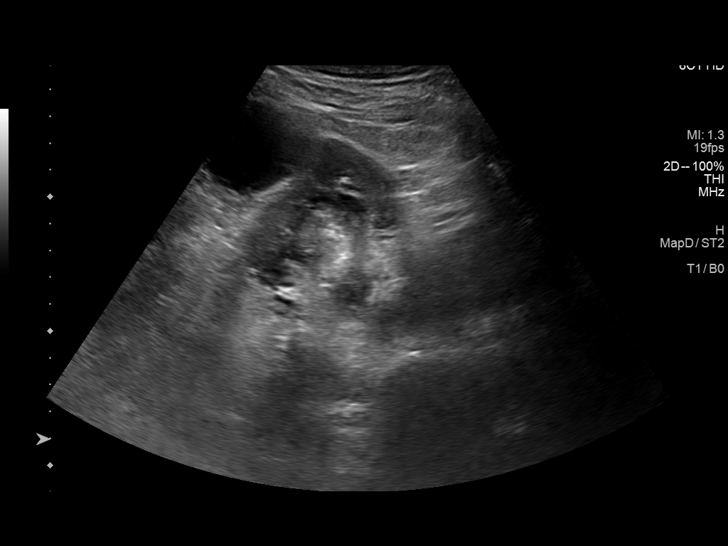
[im 65/78]
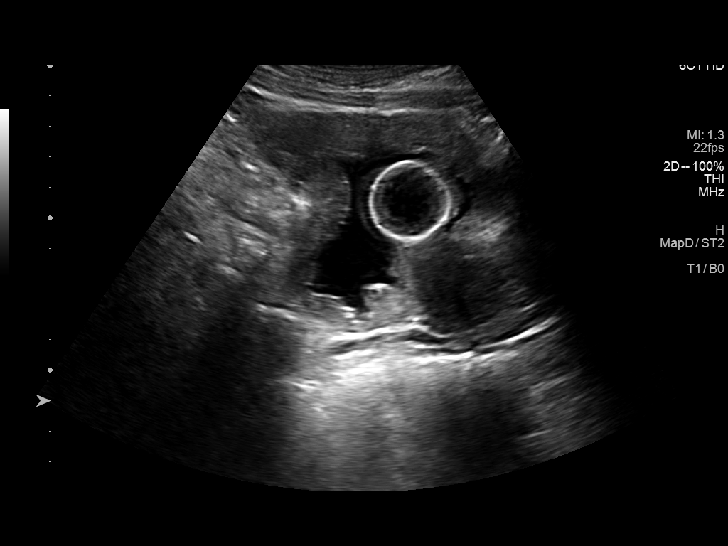
[im 71/78]
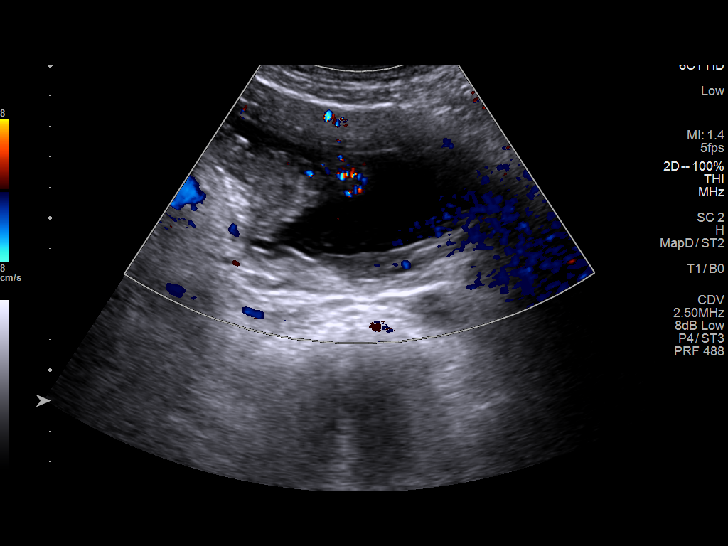
[im 78/78]
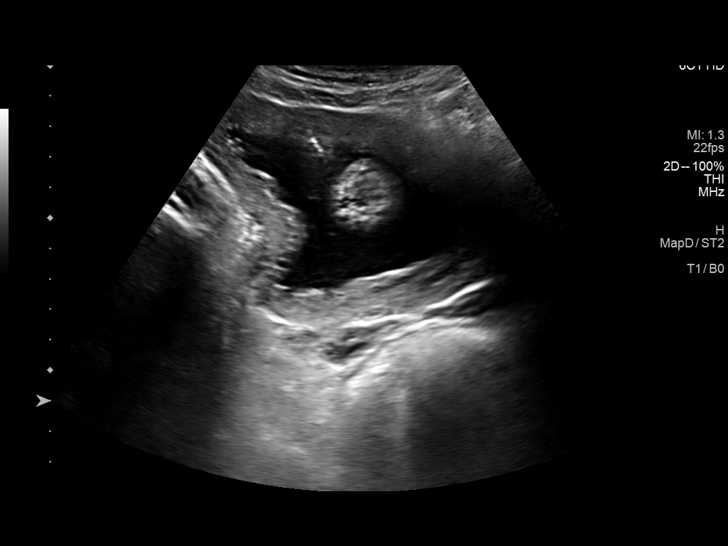

[13 of 25 positions shown; findings below may reference images not displayed]

FINDINGS: Right Kidney:

Renal measurements: 13.4 x 4.5 x 5.7 cm = volume: 182 mL. There is
an echogenic solid-appearing 2 x 1.6 x 1.6 cm mass in the interpolar
region of the right kidney.

Left Kidney:

Renal measurements: 13.1 x 6.4 x 7.2 cm = volume: 318 mL. There is a
7.3 cm exophytic cyst. There is dilatation of the upper pole calyx
versus a peripelvic cyst. There is a solid-appearing 2.4 cm mass in
the lower pole.

Bladder:

There is a Foley catheter in place. The bladder is decompressed
which limits evaluation. There appears to be diffuse bladder wall
thickening. This bladder wall thickening may be asymmetric in
several locations, for example along the right bladder wall.

Other:

None.
IMPRESSION: 1. Near complete resolution of the previously demonstrated
hydronephrosis. There is mild residual dilatation of the upper pole
calyx of the left kidney.
2. Bilateral solid-appearing renal masses measuring 2.4 cm in the
lower pole the left kidney and 2 cm in the interpolar region of the
right kidney. Follow-up with a contrast-enhanced MRI is recommended.
Findings may represent a solid renal tumor such as renal cell
carcinoma or metastatic disease such as lymphoma or transitional
cell carcinoma.
3. Thickened urinary bladder wall with areas of asymmetry, for
example along the right bladder wall. Findings are likely secondary
to chronic outlet obstruction. An underlying mass is not excluded
and should be correlated with outpatient cystoscopy.

These results will be called to the ordering clinician or
representative by the Radiologist Assistant, and communication
documented in the PACS or zVision Dashboard.
